# Patient Record
Sex: Female | Born: 1985
Health system: Southern US, Community
[De-identification: ages and names within clinical notes are randomized; demographics above are authoritative.]

## PROBLEM LIST (undated history)

## (undated) DIAGNOSIS — K219 Gastro-esophageal reflux disease without esophagitis: Secondary | ICD-10-CM

## (undated) DIAGNOSIS — Z8742 Personal history of other diseases of the female genital tract: Secondary | ICD-10-CM

## (undated) DIAGNOSIS — F319 Bipolar disorder, unspecified: Secondary | ICD-10-CM

## (undated) DIAGNOSIS — IMO0002 Reserved for concepts with insufficient information to code with codable children: Secondary | ICD-10-CM

## (undated) DIAGNOSIS — N750 Cyst of Bartholin's gland: Principal | ICD-10-CM

## (undated) DIAGNOSIS — Z8619 Personal history of other infectious and parasitic diseases: Secondary | ICD-10-CM

## (undated) DIAGNOSIS — N898 Other specified noninflammatory disorders of vagina: Secondary | ICD-10-CM

## (undated) DIAGNOSIS — F99 Mental disorder, not otherwise specified: Secondary | ICD-10-CM

## (undated) DIAGNOSIS — F419 Anxiety disorder, unspecified: Secondary | ICD-10-CM

## (undated) DIAGNOSIS — B379 Candidiasis, unspecified: Secondary | ICD-10-CM

## (undated) HISTORY — DX: Personal history of other diseases of the female genital tract: Z87.42

## (undated) HISTORY — PX: VEIN LIGATION AND STRIPPING: SHX2653

## (undated) HISTORY — DX: Candidiasis, unspecified: B37.9

## (undated) HISTORY — PX: NO PAST SURGERIES: SHX2092

## (undated) HISTORY — PX: WISDOM TOOTH EXTRACTION: SHX21

## (undated) HISTORY — DX: Bipolar disorder, unspecified: F31.9

## (undated) HISTORY — DX: Other specified noninflammatory disorders of vagina: N89.8

## (undated) HISTORY — DX: Cyst of Bartholin's gland: N75.0

## (undated) HISTORY — DX: Gastro-esophageal reflux disease without esophagitis: K21.9

## (undated) HISTORY — DX: Reserved for concepts with insufficient information to code with codable children: IMO0002

## (undated) HISTORY — DX: Personal history of other infectious and parasitic diseases: Z86.19

## (undated) SURGERY — Surgical Case
Anesthesia: *Unknown

---

## 2007-12-09 ENCOUNTER — Encounter (INDEPENDENT_AMBULATORY_CARE_PROVIDER_SITE_OTHER): Payer: Self-pay | Admitting: *Deleted

## 2008-01-19 ENCOUNTER — Ambulatory Visit: Payer: Self-pay | Admitting: Family Medicine

## 2008-01-19 DIAGNOSIS — F32A Depression, unspecified: Secondary | ICD-10-CM | POA: Insufficient documentation

## 2008-01-19 DIAGNOSIS — K219 Gastro-esophageal reflux disease without esophagitis: Secondary | ICD-10-CM | POA: Insufficient documentation

## 2008-01-19 DIAGNOSIS — F411 Generalized anxiety disorder: Secondary | ICD-10-CM | POA: Insufficient documentation

## 2008-01-20 ENCOUNTER — Telehealth (INDEPENDENT_AMBULATORY_CARE_PROVIDER_SITE_OTHER): Payer: Self-pay | Admitting: *Deleted

## 2008-01-23 ENCOUNTER — Encounter: Payer: Self-pay | Admitting: Family Medicine

## 2008-01-26 ENCOUNTER — Telehealth (INDEPENDENT_AMBULATORY_CARE_PROVIDER_SITE_OTHER): Payer: Self-pay | Admitting: *Deleted

## 2008-02-02 ENCOUNTER — Ambulatory Visit: Payer: Self-pay | Admitting: Family Medicine

## 2008-02-10 ENCOUNTER — Telehealth (INDEPENDENT_AMBULATORY_CARE_PROVIDER_SITE_OTHER): Payer: Self-pay | Admitting: *Deleted

## 2008-05-16 ENCOUNTER — Ambulatory Visit: Payer: Self-pay | Admitting: Family Medicine

## 2008-05-23 ENCOUNTER — Ambulatory Visit: Payer: Self-pay | Admitting: *Deleted

## 2008-06-01 ENCOUNTER — Ambulatory Visit: Payer: Self-pay | Admitting: *Deleted

## 2008-06-06 ENCOUNTER — Ambulatory Visit: Payer: Self-pay | Admitting: *Deleted

## 2008-06-13 ENCOUNTER — Ambulatory Visit: Payer: Self-pay | Admitting: *Deleted

## 2008-06-13 ENCOUNTER — Ambulatory Visit: Payer: Self-pay | Admitting: Family Medicine

## 2008-07-13 ENCOUNTER — Encounter: Payer: Self-pay | Admitting: Family Medicine

## 2008-12-17 ENCOUNTER — Ambulatory Visit: Payer: Self-pay | Admitting: Family Medicine

## 2008-12-17 DIAGNOSIS — L659 Nonscarring hair loss, unspecified: Secondary | ICD-10-CM | POA: Insufficient documentation

## 2008-12-17 LAB — CONVERTED CEMR LAB
Basophils Absolute: 0 10*3/uL (ref 0.0–0.1)
Eosinophils Absolute: 0.1 10*3/uL (ref 0.0–0.7)
Hemoglobin: 14.8 g/dL (ref 12.0–15.0)
Lymphocytes Relative: 34.9 % (ref 12.0–46.0)
MCHC: 34.2 g/dL (ref 30.0–36.0)
Monocytes Relative: 5.9 % (ref 3.0–12.0)
Neutro Abs: 3.3 10*3/uL (ref 1.4–7.7)
Neutrophils Relative %: 57.3 % (ref 43.0–77.0)
Platelets: 315 10*3/uL (ref 150.0–400.0)
Prolactin: 19.1 ng/mL
RDW: 11.7 % (ref 11.5–14.6)
TSH: 1.22 microintl units/mL (ref 0.35–5.50)

## 2008-12-18 LAB — CONVERTED CEMR LAB
Sex Hormone Binding: 35 nmol/L (ref 18–114)
Testosterone Free: 8.3 pg/mL — ABNORMAL HIGH (ref 0.6–6.8)
Testosterone-% Free: 1.7 % (ref 0.4–2.4)
Testosterone: 47.66 ng/dL (ref 10–70)

## 2008-12-19 ENCOUNTER — Telehealth (INDEPENDENT_AMBULATORY_CARE_PROVIDER_SITE_OTHER): Payer: Self-pay | Admitting: *Deleted

## 2009-01-31 ENCOUNTER — Telehealth (INDEPENDENT_AMBULATORY_CARE_PROVIDER_SITE_OTHER): Payer: Self-pay | Admitting: *Deleted

## 2009-08-28 ENCOUNTER — Encounter: Payer: Self-pay | Admitting: Family Medicine

## 2010-01-15 ENCOUNTER — Encounter: Payer: Self-pay | Admitting: Family Medicine

## 2010-05-02 DIAGNOSIS — Z8742 Personal history of other diseases of the female genital tract: Secondary | ICD-10-CM

## 2010-05-02 DIAGNOSIS — N750 Cyst of Bartholin's gland: Secondary | ICD-10-CM

## 2010-05-02 HISTORY — DX: Cyst of Bartholin's gland: N75.0

## 2010-05-02 HISTORY — DX: Personal history of other diseases of the female genital tract: Z87.42

## 2010-05-06 NOTE — Letter (Signed)
Summary: [facility]  [facility]   Imported By: Lanelle Bal 10/15/2009 12:20:25  _____________________________________________________________________  External Attachment:    Type:   Image     Comment:   External Document

## 2010-05-08 NOTE — Letter (Signed)
Summary: Coralie Carpen MD  Coralie Carpen MD   Imported By: Lanelle Bal 04/24/2010 13:30:25  _____________________________________________________________________  External Attachment:    Type:   Image     Comment:   External Document

## 2010-06-16 ENCOUNTER — Encounter: Payer: Self-pay | Admitting: Family Medicine

## 2010-06-16 ENCOUNTER — Encounter (INDEPENDENT_AMBULATORY_CARE_PROVIDER_SITE_OTHER): Payer: 59 | Admitting: Family Medicine

## 2010-06-16 DIAGNOSIS — Z0000 Encounter for general adult medical examination without abnormal findings: Secondary | ICD-10-CM

## 2010-06-16 DIAGNOSIS — Z3009 Encounter for other general counseling and advice on contraception: Secondary | ICD-10-CM

## 2010-06-19 ENCOUNTER — Other Ambulatory Visit: Payer: Self-pay | Admitting: Family Medicine

## 2010-06-19 ENCOUNTER — Other Ambulatory Visit (INDEPENDENT_AMBULATORY_CARE_PROVIDER_SITE_OTHER): Payer: 59

## 2010-06-19 ENCOUNTER — Encounter (INDEPENDENT_AMBULATORY_CARE_PROVIDER_SITE_OTHER): Payer: Self-pay | Admitting: *Deleted

## 2010-06-19 DIAGNOSIS — E785 Hyperlipidemia, unspecified: Secondary | ICD-10-CM

## 2010-06-19 DIAGNOSIS — Z0000 Encounter for general adult medical examination without abnormal findings: Secondary | ICD-10-CM

## 2010-06-19 LAB — CBC WITH DIFFERENTIAL/PLATELET
Basophils Absolute: 0.1 10*3/uL (ref 0.0–0.1)
Basophils Relative: 1 % (ref 0.0–3.0)
Eosinophils Absolute: 0.2 10*3/uL (ref 0.0–0.7)
Hemoglobin: 13.8 g/dL (ref 12.0–15.0)
Lymphocytes Relative: 38 % (ref 12.0–46.0)
MCHC: 34 g/dL (ref 30.0–36.0)
MCV: 93.1 fl (ref 78.0–100.0)
Monocytes Absolute: 0.5 10*3/uL (ref 0.1–1.0)
Neutro Abs: 3.4 10*3/uL (ref 1.4–7.7)
Neutrophils Relative %: 51.2 % (ref 43.0–77.0)
RBC: 4.36 Mil/uL (ref 3.87–5.11)
RDW: 13.5 % (ref 11.5–14.6)

## 2010-06-19 LAB — HEPATIC FUNCTION PANEL
AST: 14 U/L (ref 0–37)
Albumin: 3.9 g/dL (ref 3.5–5.2)
Total Bilirubin: 0.4 mg/dL (ref 0.3–1.2)

## 2010-06-19 LAB — BASIC METABOLIC PANEL
BUN: 14 mg/dL (ref 6–23)
Calcium: 9.2 mg/dL (ref 8.4–10.5)
Creatinine, Ser: 0.8 mg/dL (ref 0.4–1.2)
GFR: 88.69 mL/min (ref >60.00)
Glucose, Bld: 79 mg/dL (ref 70–99)

## 2010-06-19 LAB — LIPID PANEL
Cholesterol: 201 mg/dL — ABNORMAL HIGH (ref 0–200)
Total CHOL/HDL Ratio: 4
Triglycerides: 99 mg/dL (ref 0.0–149.0)
VLDL: 19.8 mg/dL (ref 0.0–40.0)

## 2010-06-24 NOTE — Assessment & Plan Note (Signed)
Summary: cpe/pt hasnt been seen since 2010/needs birth control/kn   Vital Signs:  Patient profile:   25 year old female Height:      66.75 inches (169.55 cm) Weight:      170.50 pounds (77.50 kg) BMI:     27.00 Temp:     97.0 degrees F (36.11 degrees C) oral BP sitting:   100 / 70  (left arm) Cuff size:   regular  Vitals Entered By: Lucious Groves CMA (June 16, 2010 3:28 PM) CC: CPX--no pap./kb Is Patient Diabetic? No Pain Assessment Patient in pain? no      Comments Patient needs birth control renewal, hairloss is somewhat better.   History of Present Illness: 25 yo woman here today for CPE.  irregular menses- hasn't been on birth control in over a year.  LMP- 2/29.  no chance of pregnancy.  pt reports that OCPs help decrease hair loss.  difficulty concentrating- seeing Dr Tomasa Rand.  Preventive Screening-Counseling & Management  Alcohol-Tobacco     Alcohol drinks/day: <1     Smoking Status: never  Caffeine-Diet-Exercise     Does Patient Exercise: yes     Type of exercise: walking, lifting, basketball      Drug Use:  never.    Current Medications (verified): 1)  Sprintec 28 0.25-35 Mg-Mcg Tabs (Norgestimate-Eth Estradiol) .... Take One Tablet Daily 2)  Lamictal 200 Mg Tabs (Lamotrigine) .... Once Daily 3)  Biotin (Dose Unknown) .... Two Daily 4)  Mvi .... Daily  Allergies (verified): No Known Drug Allergies  Past History:  Past Medical History: GERD Vaginal cyst Bipolar  Past Surgical History: vein stripping  Social History: Graduated w/ BA, works in jail for [employer], lives alone no tobacco has catDoes Patient Exercise:  yes Drug Use:  never  Review of Systems       The patient complains of anorexia and headaches.  The patient denies fever, weight loss, weight gain, vision loss, decreased hearing, hoarseness, chest pain, syncope, dyspnea on exertion, peripheral edema, prolonged cough, abdominal pain, melena, hematochezia, severe  indigestion/heartburn, hematuria, suspicious skin lesions, depression, abnormal bleeding, enlarged lymph nodes, and breast masses.         anorexia after breakup, headaches are stress related due to new job (per pt report)  Physical Exam  General:  Well-developed,well-nourished,in no acute distress; alert,appropriate and cooperative throughout examination Head:  Normocephalic and atraumatic without obvious abnormalities. No apparent alopecia or balding- no widening of part or loss of hair at crown. Eyes:  No corneal or conjunctival inflammation noted. EOMI. Perrla. Funduscopic exam benign, without hemorrhages, exudates or papilledema. Vision grossly normal. Ears:  External ear exam shows no significant lesions or deformities.  Otoscopic examination reveals clear canals, tympanic membranes are intact bilaterally without bulging, retraction, inflammation or discharge. Hearing is grossly normal bilaterally. Nose:  External nasal examination shows no deformity or inflammation. Nasal mucosa are pink and moist without lesions or exudates. Mouth:  Oral mucosa and oropharynx without lesions or exudates.  Teeth in good repair. Neck:  No deformities, masses, or tenderness noted. Breasts:  gyn Lungs:  Normal respiratory effort, chest expands symmetrically. Lungs are clear to auscultation, no crackles or wheezes. Heart:  Normal rate and regular rhythm. S1 and S2 normal without gallop, murmur, click, rub or other extra sounds. Abdomen:  Bowel sounds positive,abdomen soft and non-tender without masses, organomegaly or hernias noted. Genitalia:  gyn Pulses:  +2 carotid, radial, DP Extremities:  No clubbing, cyanosis, edema, or deformity noted with normal full range  of motion of all joints.   Neurologic:  No cranial nerve deficits noted. Station and gait are normal. Plantar reflexes are down-going bilaterally. DTRs are symmetrical throughout. Sensory, motor and coordinative functions appear intact. Skin:   Intact without suspicious lesions or rashes Cervical Nodes:  No lymphadenopathy noted Axillary Nodes:  No palpable lymphadenopathy Psych:  Cognition and judgment appear intact. Alert and cooperative with normal attention span and concentration. No apparent delusions, illusions, hallucinations   Impression & Recommendations:  Problem # 1:  PHYSICAL EXAMINATION (ICD-V70.0) Assessment New pt's PE WNL.  will return for labs when fasting.  anticipatory guidance provided.  UTD on pap w/ GYN  Problem # 2:  CONTRACEPTIVE MANAGEMENT (ICD-V25.09) Assessment: New restart OCPs after next period.  reviewed appropriate use.  Complete Medication List: 1)  Sprintec 28 0.25-35 Mg-mcg Tabs (Norgestimate-eth estradiol) .... Take one tablet daily 2)  Lamictal 200 Mg Tabs (Lamotrigine) .... Once daily 3)  Biotin (dose Unknown)  .... Two daily 4)  Mvi  .... Daily  Patient Instructions: 1)  Schedule a lab visit for later this week- do not eat before this appt 2)  BMP prior to visit, ICD-9: V70 3)  Hepatic Panel prior to visit ICD-9: V70 4)  Lipid panel prior to visit ICD-9 : V70 5)  TSH prior to visit ICD-9 : V70 6)  CBC w/ Diff prior to visit ICD-9 : V70 7)  Keep up the good work on diet and exercise- your exam looks great!!! 8)  We'll notify you of your lab results once we have them 9)  Restart the birth control the Sunday after your next bleed 10)  Call with any questions or concerns 11)  Hang in there!!!  Things will get better! Prescriptions: SPRINTEC 28 0.25-35 MG-MCG TABS (NORGESTIMATE-ETH ESTRADIOL) take one tablet daily  #28 x 11   Entered and Authorized by:   Duvid Smalls MD   Signed by:   Margurite Duffy MD on 06/16/2010   Method used:   Electronically to        [facility] [address] (retail)       [address]       [place], [place]  [address]       Ph: [phone] or [phone]       Fax: [phone]   RxID:   [id number]    Orders  Added: 1)  Est. Patient 18-39 years [phone]

## 2010-10-16 ENCOUNTER — Encounter (HOSPITAL_COMMUNITY): Payer: Self-pay | Admitting: *Deleted

## 2010-10-27 ENCOUNTER — Telehealth: Payer: Self-pay | Admitting: *Deleted

## 2010-10-27 NOTE — Telephone Encounter (Signed)
Pt called back and states that she will come pick it up this PM per Okey Regal.

## 2010-10-27 NOTE — Telephone Encounter (Signed)
I called pt about her paperwork to find out if she would like to pick it up or have our office mail it to her. Left message on voicemail to call the office.

## 2010-10-29 ENCOUNTER — Other Ambulatory Visit: Payer: Self-pay | Admitting: Obstetrics and Gynecology

## 2010-10-29 ENCOUNTER — Ambulatory Visit (HOSPITAL_COMMUNITY): Payer: 59 | Admitting: Anesthesiology

## 2010-10-29 ENCOUNTER — Ambulatory Visit (HOSPITAL_COMMUNITY)
Admission: RE | Admit: 2010-10-29 | Discharge: 2010-10-29 | Disposition: A | Discharge status: Home / Self Care | Payer: 59 | Source: Ambulatory Visit | Attending: Obstetrics and Gynecology | Admitting: Obstetrics and Gynecology

## 2010-10-29 ENCOUNTER — Encounter (HOSPITAL_COMMUNITY): Payer: Self-pay | Admitting: Anesthesiology

## 2010-10-29 ENCOUNTER — Encounter (HOSPITAL_COMMUNITY)
Admission: RE | Disposition: A | Discharge status: Home / Self Care | Payer: Self-pay | Source: Ambulatory Visit | Attending: Obstetrics and Gynecology

## 2010-10-29 DIAGNOSIS — N840 Polyp of corpus uteri: Secondary | ICD-10-CM | POA: Insufficient documentation

## 2010-10-29 DIAGNOSIS — K219 Gastro-esophageal reflux disease without esophagitis: Secondary | ICD-10-CM

## 2010-10-29 DIAGNOSIS — L659 Nonscarring hair loss, unspecified: Secondary | ICD-10-CM

## 2010-10-29 DIAGNOSIS — F411 Generalized anxiety disorder: Secondary | ICD-10-CM

## 2010-10-29 DIAGNOSIS — Z9889 Other specified postprocedural states: Secondary | ICD-10-CM

## 2010-10-29 HISTORY — DX: Mental disorder, not otherwise specified: F99

## 2010-10-29 HISTORY — DX: Anxiety disorder, unspecified: F41.9

## 2010-10-29 LAB — CBC
MCV: 91.3 fL (ref 78.0–100.0)
Platelets: 300 10*3/uL (ref 150–400)
RBC: 4.58 MIL/uL (ref 3.87–5.11)
WBC: 6.7 10*3/uL (ref 4.0–10.5)

## 2010-10-29 SURGERY — DILATATION & CURETTAGE/HYSTEROSCOPY WITH RESECTOCOPE
Anesthesia: General

## 2010-10-29 MED ORDER — GLYCINE 1.5 % IR SOLN
Status: DC | PRN
  Administered 2010-10-29: 3000 mL

## 2010-10-29 MED ORDER — LIDOCAINE HCL 1 % IJ SOLN
INTRAMUSCULAR | Status: DC | PRN
  Administered 2010-10-29: 15 mL via INTRADERMAL

## 2010-10-29 MED ORDER — MIDAZOLAM HCL 5 MG/5ML IJ SOLN
INTRAMUSCULAR | Status: DC | PRN
  Administered 2010-10-29: 2 mg via INTRAVENOUS

## 2010-10-29 MED ORDER — LIDOCAINE HCL (CARDIAC) 20 MG/ML IV SOLN
INTRAVENOUS | Status: DC | PRN
  Administered 2010-10-29: 80 mg via INTRAVENOUS

## 2010-10-29 MED ORDER — DEXAMETHASONE SODIUM PHOSPHATE 10 MG/ML IJ SOLN
INTRAMUSCULAR | Status: AC
  Filled 2010-10-29: qty 1

## 2010-10-29 MED ORDER — FENTANYL CITRATE 0.05 MG/ML IJ SOLN
INTRAMUSCULAR | Status: AC
  Filled 2010-10-29: qty 2

## 2010-10-29 MED ORDER — KETOROLAC TROMETHAMINE 30 MG/ML IJ SOLN
15.0000 mg | Freq: Once | INTRAMUSCULAR | Status: AC | PRN
  Administered 2010-10-29: 30 mg via INTRAVENOUS

## 2010-10-29 MED ORDER — LACTATED RINGERS IV SOLN
INTRAVENOUS | Status: DC
  Administered 2010-10-29 (×2): via INTRAVENOUS

## 2010-10-29 MED ORDER — FENTANYL CITRATE 0.05 MG/ML IJ SOLN
INTRAMUSCULAR | Status: DC | PRN
  Administered 2010-10-29 (×2): 50 ug via INTRAVENOUS

## 2010-10-29 MED ORDER — FENTANYL CITRATE 0.05 MG/ML IJ SOLN
INTRAMUSCULAR | Status: AC
  Administered 2010-10-29: 25 ug via INTRAVENOUS
  Filled 2010-10-29: qty 2

## 2010-10-29 MED ORDER — MIDAZOLAM HCL 2 MG/2ML IJ SOLN
INTRAMUSCULAR | Status: AC
  Filled 2010-10-29: qty 2

## 2010-10-29 MED ORDER — HYDROCODONE-ACETAMINOPHEN 5-500 MG PO TABS: 1.0000 | ORAL_TABLET | Freq: Four times a day (QID) | ORAL | Status: AC | PRN

## 2010-10-29 MED ORDER — FENTANYL CITRATE 0.05 MG/ML IJ SOLN
25.0000 ug | INTRAMUSCULAR | Status: DC | PRN
  Administered 2010-10-29: 25 ug via INTRAVENOUS

## 2010-10-29 MED ORDER — ONDANSETRON HCL 4 MG/2ML IJ SOLN
INTRAMUSCULAR | Status: AC
  Filled 2010-10-29: qty 2

## 2010-10-29 MED ORDER — PROPOFOL 10 MG/ML IV EMUL
INTRAVENOUS | Status: AC
  Filled 2010-10-29: qty 20

## 2010-10-29 MED ORDER — KETOROLAC TROMETHAMINE 30 MG/ML IJ SOLN
INTRAMUSCULAR | Status: AC
  Administered 2010-10-29: 30 mg via INTRAVENOUS
  Filled 2010-10-29: qty 1

## 2010-10-29 MED ORDER — PROPOFOL 10 MG/ML IV EMUL
INTRAVENOUS | Status: DC | PRN
  Administered 2010-10-29: 200 mg via INTRAVENOUS

## 2010-10-29 MED ORDER — IBUPROFEN 200 MG PO TABS: 600.0000 mg | ORAL_TABLET | Freq: Four times a day (QID) | ORAL | Status: AC | PRN

## 2010-10-29 MED ORDER — LIDOCAINE HCL (CARDIAC) 20 MG/ML IV SOLN
INTRAVENOUS | Status: AC
  Filled 2010-10-29: qty 5

## 2010-10-29 MED ORDER — GLYCOPYRROLATE 0.2 MG/ML IJ SOLN
INTRAMUSCULAR | Status: DC | PRN
  Administered 2010-10-29: 0.2 mg via INTRAVENOUS

## 2010-10-29 SURGICAL SUPPLY — 37 items
ABLATOR ENDOMETRIAL BIPOLAR (ABLATOR) IMPLANT
BLADE SURG 11 STRL SS (BLADE) ×2 IMPLANT
BLADE SURG 15 STRL LF C SS BP (BLADE) ×1 IMPLANT
BLADE SURG 15 STRL SS (BLADE) ×1
CANISTER SUCTION 2500CC (MISCELLANEOUS) ×2 IMPLANT
CATH ROBINSON RED A/P 16FR (CATHETERS) ×2 IMPLANT
CLOTH BEACON ORANGE TIMEOUT ST (SAFETY) ×2 IMPLANT
CONTAINER PREFILL 10% NBF 15ML (MISCELLANEOUS) ×2 IMPLANT
CONTAINER PREFILL 10% NBF 60ML (FORM) ×4 IMPLANT
COUNTER NEEDLE 1200 MAGNETIC (NEEDLE) IMPLANT
DRAPE UTILITY XL STRL (DRAPES) ×4 IMPLANT
ELECT REM PT RETURN 9FT ADLT (ELECTROSURGICAL) ×2
ELECTRODE REM PT RTRN 9FT ADLT (ELECTROSURGICAL) ×1 IMPLANT
ELECTRODE ROLLER BARREL 22FR (ELECTROSURGICAL) IMPLANT
ELECTRODE VAPORCUT 22FR (ELECTROSURGICAL) IMPLANT
GLOVE BIO SURGEON STRL SZ 6.5 (GLOVE) ×2 IMPLANT
GLOVE BIO SURGEON STRL SZ7 (GLOVE) ×4 IMPLANT
GLOVE BIOGEL PI IND STRL 7.0 (GLOVE) ×1 IMPLANT
GLOVE BIOGEL PI INDICATOR 7.0 (GLOVE) ×1
GOWN PREVENTION PLUS LG XLONG (DISPOSABLE) ×4 IMPLANT
LOOP ANGLED CUTTING 22FR (CUTTING LOOP) IMPLANT
NEEDLE HYPO 25X1 1.5 SAFETY (NEEDLE) ×2 IMPLANT
NS IRRIG 1000ML POUR BTL (IV SOLUTION) ×2 IMPLANT
PACK HYSTEROSCOPY LF (CUSTOM PROCEDURE TRAY) ×2 IMPLANT
PACK VAGINAL MINOR WOMEN LF (CUSTOM PROCEDURE TRAY) ×2 IMPLANT
PAD PREP 24X48 CUFFED NSTRL (MISCELLANEOUS) ×2 IMPLANT
PENCIL BUTTON HOLSTER BLD 10FT (ELECTRODE) IMPLANT
SUT CHROMIC 3 0 PS 2 (SUTURE) ×2 IMPLANT
SUT VIC AB 4-0 PS2 27 (SUTURE) IMPLANT
SWAB CULTURE LIQ STUART DBL (MISCELLANEOUS) IMPLANT
SYR 20CC LL (SYRINGE) ×2 IMPLANT
SYR CONTROL 10ML LL (SYRINGE) IMPLANT
TOWEL OR 17X24 6PK STRL BLUE (TOWEL DISPOSABLE) ×4 IMPLANT
TUBE ANAEROBIC SPECIMEN COL (MISCELLANEOUS) IMPLANT
TUBING NON-CON 1/4 X 20 CONN (TUBING) IMPLANT
WATER STERILE IRR 1000ML POUR (IV SOLUTION) ×2 IMPLANT
YANKAUER SUCT BULB TIP NO VENT (SUCTIONS) IMPLANT

## 2010-10-29 NOTE — H&P (Signed)
Dictated [phone]

## 2010-10-29 NOTE — Brief Op Note (Signed)
10/29/2010  10:27 AM  PATIENT:  Carrie Diaz  25 y.o. female  PRE-OPERATIVE DIAGNOSIS:  endometrial polyp;barthlon cyst  POST-OPERATIVE DIAGNOSIS:  endometrial polyp;barthlon cyst resolved  PROCEDURE:  Procedure(s): DILATATION & CURETTAGE/HYSTEROSCOPY WITH RESECTOCOPE  SURGEON:  Surgeon(s): Emilyanne Mcgough A Lehua Flores, MD  PHYSICIAN ASSISTANT:   ASSISTANTS: none   ANESTHESIA:   general and paracervical block  ESTIMATED BLOOD LOSS: MINIMAL  BLOOD ADMINISTERED:none  DRAINS: none   LOCAL MEDICATIONS USED:  LIDOCAINE 15CC  SPECIMEN:  Source of Specimen:  endometrial curettings  DISPOSITION OF SPECIMEN:  PATHOLOGY  COUNTS:  YES  TOURNIQUET:  * No tourniquets in log *  DICTATION #: The patient was taken to the operating room and prepped and draped in a normal sterile fashion. An in out catheter was used to drain the bladder a bivalve speculum was placed into the vagina and anterior lip of the cervix was grasped with a single-tooth tenaculum.  15 cc of 1% lidocaine was used for cervical block.  the cervix was then dilated with Shawnie Pons dilators up to 21. The hysteroscope was placed into the uterine cavity. The cervix and the entire uterus and both ostia were visualized. There were no polyps. Just fluffy endometrium. I then decreased the  fluid  pressure from the outflow of the fluid into the uterus and still no polyp was visualized. Hyseroscope was then removed from the uterus. A sharp curettage was then done with a curette and endometrial curettings were obtained. The endometrial curettings were sent to pathology. Again the hysteroscope was placed into the uterine cavity. Both ostia were again visualized there were no polyps or submucosal fibroids or endometrial masses seen. The tenaculum was removed from the cervix and hemostasis was noted.  Tenaculum was removed from the  cervix. The vagina and labia were inspected  and there was no remnants of a Bartholin's cyst.   the marsupialization did not need  to be done.  PLAN OF CARE: discharge to home  PATIENT DISPOSITION:  PACU - hemodynamically stable.   Delay start of Pharmacological VTE agent (>24hrs) due to surgical blood loss or risk of bleeding:  no

## 2010-10-29 NOTE — H&P (Signed)
Carrie Diaz, MACDOWELL                 ACCOUNT NO.:  [id number]  MEDICAL RECORD NO.:  [id number]  LOCATION:                                 FACILITY:  PHYSICIAN:  Trevor Duty A. Kamaury Cutbirth, M.D. DATE OF BIRTH:  06-Feb-1986  DATE OF ADMISSION: DATE OF DISCHARGE:                             HISTORY & PHYSICAL   CHIEF COMPLAINT:  Endometrial polyps and Bartholin cyst.  The patient is a 25 year old gravida 0 who presented to me in January 2012 with dysmenorrhea and complaining of the Bartholin cyst.  She states the Bartholin cyst has been there for years but it has become more tender than usual.  No fevers or chills, and she does want marsupialization.  She stated that her menses were every month lasting for 4 to 5 days, changes pad every 2-3 hours with dysmenorrhea.  Midol is the only thing that really helps.  The patient had an ultrasound which demonstrated uterus measuring 3.4 x 6.3 x 4.3 with normal ovaries. She had a polyp it was 0.74 x 1.17 x 0.93 cm.  The patient decided to have a polypectomy.  She had an I and D of her Bartholin abscess in June 2012 and desires marsupialization of it in anyway present at the time of surgery.  The patient has no known drug allergies.  Her medications including Sprintec, Xanax, and Lamictal.  PAST MEDICAL HISTORY:  Significant for bipolar disorder as above.  FAMILY HISTORY:  No breast or ovarian cancer.  The patient is not allergic to any medicines.  REVIEW OF SYSTEMS:  PSYCHIATRIC:  Significant for bipolar disorder. GENITOURINARY:  Significant for dysmenorrhea and Bartholin. MUSCULOSKELETAL:  No weakness.  CARDIOVASCULAR:  No heart palpitations.  PHYSICAL EXAMINATION:  VITAL SIGNS:  Blood pressure 100/60, weight 164 pounds.  She is 5 feet 7 inches with a BMI of 26. HEENT:  Her pupils are equal.  Her hearing is normal. NECK:  Her throat is clear.  Thyroid is not enlarged. HEART:  Regular rate and rhythm. CHEST:  Lungs are clear to auscultation  bilaterally. BREASTS:  No masses, discharge, skin changes, or nipple retraction. BACK:  No CVA tenderness. ABDOMEN:  Nontender without any mass or organomegaly. EXTREMITIES:  No cyanosis, clubbing, or edema. NEUROLOGICAL:  Within normal limits. GENITALIA:  Full vaginal exam is within normal limits.  Cervix is nontender without any lesions.  On her vulva exam, she does have a left labia Bartholin cyst.  The uterus is normal shape, size, consistency, and nontender.  Adnexa no masses and nontender.  ASSESSMENT:  Endometrial polyps and Bartholin gland cyst.  __________ abscess in June which was drained and cyst is still present.  She desires marsupialization.  She understands the risks are not limited to bleeding, infection, and dyspareunia.  The patient also has endometrial polyps.  She was given the choice of observation versus a D and C hysteroscopy.  She chose the latter.  She understands the risks are not limited to bleeding, infection, damage to internal organs such as bowel, bladder, and major blood vessels.     [name] A. Normand Sloop, M.D.     NAD/MEDQ  D:  10/28/2010  T:  10/29/2010  Job:  [id number] 

## 2010-10-29 NOTE — Anesthesia Postprocedure Evaluation (Addendum)
  Anesthesia Post-op Note  Patient: Carrie Diaz  Procedure(s) Performed:  DILATATION & CURETTAGE/HYSTEROSCOPY WITH RESECTOCOPE - No resectoscope  Patient Location: PACU  Anesthesia Type: General  Level of Consciousness: awake and oriented  Airway and Oxygen Therapy: Patient Spontanous Breathing and Patient connected to nasal cannula oxygen  Post-op Pain: mild  Post-op Assessment: Post-op Vital signs reviewed  Post-op Vital Signs: Reviewed and stable  Complications: No apparent anesthesia complications  No anesthesia complications.  Level of consciousness: alert. Cardiopulmonary status stable.  No follow-up care or observation required.  Dorianne Perret L. Rodman Pickle, MD

## 2010-10-29 NOTE — Anesthesia Preprocedure Evaluation (Signed)
Anesthesia Evaluation  Name, MR# and DOB Patient awake  General Assessment Comment  Reviewed: Allergy & Precautions, H&P , Patient's Chart, lab work & pertinent test results and reviewed documented beta blocker date and time   History of Anesthesia Complications Negative for: history of anesthetic complications  Airway Mallampati: III TM Distance: >3 FB Neck ROM: full    Dental  (+) Chipped,    Pulmonaryneg pulmonary ROS    clear to auscultation    Cardiovascular regular Normal   Neuro/Psych (+) {AN ROS/MED HX NEURO HEADACHES (+) PSYCHIATRIC DISORDERS (anxiety, bipolar d/o - did not take meds today),   GI/Hepatic/Renal negative Liver ROS, and negative Renal ROS (+)  GERD (currently not on medication.  relates more to timing of eating/sleeping)      Endo/Other  Negative Endocrine ROS (+)   Abdominal   Musculoskeletal  Hematology negative hematology ROS (+)   Peds  Reproductive/Obstetrics negative OB ROS   Anesthesia Other Findings             Anesthesia Physical Anesthesia Plan  ASA: II  Anesthesia Plan: General   Post-op Pain Management:    Induction:   Airway Management Planned:   Additional Equipment:   Intra-op Plan:   Post-operative Plan:   Informed Consent: I have reviewed the patients History and Physical, chart, labs and discussed the procedure including the risks, benefits and alternatives for the proposed anesthesia with the patient or authorized representative who has indicated his/her understanding and acceptance.   Dental Advisory Given  Plan Discussed with:   Anesthesia Plan Comments:         Anesthesia Quick Evaluation

## 2010-10-29 NOTE — Transfer of Care (Signed)
Immediate Anesthesia Transfer of Care Note  Patient: Carrie Diaz  Procedure(s) Performed:  DILATATION & CURETTAGE/HYSTEROSCOPY WITH RESECTOCOPE - No resectoscope  Patient Location: PACU  Anesthesia Type: MAC and General  Level of Consciousness: awake, alert , oriented and patient cooperative  Airway & Oxygen Therapy: Patient Spontanous Breathing and Patient connected to nasal cannula oxygen  Post-op Assessment: Report given to PACU RN and Post -op Vital signs reviewed and stable  Post vital signs: Reviewed and stable  Complications: No apparent anesthesia complications

## 2011-01-02 ENCOUNTER — Encounter: Payer: Self-pay | Admitting: Family Medicine

## 2011-01-05 ENCOUNTER — Encounter: Payer: Self-pay | Admitting: Family Medicine

## 2011-01-05 ENCOUNTER — Other Ambulatory Visit: Payer: 59

## 2011-01-05 ENCOUNTER — Ambulatory Visit (INDEPENDENT_AMBULATORY_CARE_PROVIDER_SITE_OTHER): Payer: 59 | Admitting: Family Medicine

## 2011-01-05 VITALS — BP 110/78 | HR 80 | Temp 97.8°F | Wt 170.0 lb

## 2011-01-05 DIAGNOSIS — Z79899 Other long term (current) drug therapy: Secondary | ICD-10-CM

## 2011-01-05 DIAGNOSIS — F3189 Other bipolar disorder: Secondary | ICD-10-CM

## 2011-01-05 DIAGNOSIS — Z13220 Encounter for screening for lipoid disorders: Secondary | ICD-10-CM

## 2011-01-05 DIAGNOSIS — M549 Dorsalgia, unspecified: Secondary | ICD-10-CM

## 2011-01-05 LAB — LIPID PANEL
Cholesterol: 174 mg/dL (ref 0–200)
HDL: 61.9 mg/dL (ref >39.00)
LDL Cholesterol: 97 mg/dL (ref 0–99)
Triglycerides: 77 mg/dL (ref 0.0–149.0)
VLDL: 15.4 mg/dL (ref 0.0–40.0)

## 2011-01-05 LAB — POCT URINALYSIS DIPSTICK
Bilirubin, UA: NEGATIVE
Glucose, UA: NEGATIVE
Ketones, UA: NEGATIVE
Leukocytes, UA: NEGATIVE
Protein, UA: NEGATIVE
Spec Grav, UA: 1.005

## 2011-01-05 MED ORDER — CYCLOBENZAPRINE HCL 10 MG PO TABS: 10.0000 mg | ORAL_TABLET | Freq: Three times a day (TID) | ORAL | Status: DC | PRN

## 2011-01-05 MED ORDER — NAPROXEN 500 MG PO TABS: 500.0000 mg | ORAL_TABLET | Freq: Two times a day (BID) | ORAL | Status: DC

## 2011-01-05 NOTE — Progress Notes (Signed)
Quick Note:    Labs mailed.  ______

## 2011-01-05 NOTE — Assessment & Plan Note (Signed)
Pt's pain is most likely muscular.  No red flags on hx or PE.  Start scheduled NSAIDs, muscle relaxer prn.  Heat, stretching for pain relief.  Reviewed supportive care and red flags that should prompt return.  Pt expressed understanding and is in agreement w/ plan.

## 2011-01-05 NOTE — Patient Instructions (Signed)
This all appears to be muscular and should improve w/ time Use the Naproxen regularly for the next 7 days- take w/ food Use the muscle relaxer before bed and on your days off- this will make you drowsiness so no driving! HEAT!!! Do some gentle stretching throughout the day Call with any questions or concerns Good luck w/ the job hunt!!

## 2011-01-05 NOTE — Progress Notes (Signed)
  Subjective:    Patient ID: Carrie Diaz, female    DOB: [birth year], 25 y.o.   MRN: [id number]  HPI Back pain- thinks she pulled a muscle in lower back last week.  Is wearing a sherrif's belt at work and feels like this is part of the problem.  Using ibuprofen w/ some relief.  Will also have some tightness in upper back.  Doesn't wear a bullet-proof vest at work.  Able to move w/out difficulty.  No radiating pain into butt or legs.   Review of Systems For ROS see HPI     Objective:   Physical Exam  Vitals reviewed. Constitutional: She is oriented to person, place, and time. She appears well-developed and well-nourished. No distress.  Musculoskeletal: Normal range of motion. She exhibits tenderness (mild TTP over lumbar paraspinals bilaterally, + TTP over trap spasm bilaterally). She exhibits no edema.       Full flexion and extension, (-) SLR bilaterally  Neurological: She is alert and oriented to person, place, and time. She has normal reflexes. Coordination normal.          Assessment & Plan:

## 2011-05-08 ENCOUNTER — Telehealth: Payer: Self-pay | Admitting: *Deleted

## 2011-05-08 NOTE — Telephone Encounter (Signed)
Noted a rx came into office via fax and noted illegible, called MD Carrie Diaz office and spoke to Carrie Diaz and received verbal orders per pt Carrie Diaz had called in for orders to be sent to our office per pt has upcoming visit with [place], noted order as follows:  Carbamazepine Level Liver Function Test CBC w diff   DX:V58.69 and 296.89 Directions: Do Not Draw Sooner than 12 hours After last Carrie Diaz Get Blood Drawn Late  Jan 2013 or Early Feb 2013  Called pt to advise about lab instructions, left pt message to call office back

## 2011-05-12 NOTE — Telephone Encounter (Signed)
Pt called in to ask to have her blood drawn today for the order requested by MD Tomasa Rand, advised per orders from MD [facility] that we can not draw the lab until she has had the medication equestro in her system at least 12 hours and until his office confirms a different order we have to go by what MD Tomasa Rand noted, pt advised that she has apt with him today and that she will have him clarify the order so she can come in. Advised that MD [facility] office needs to call order in per last rx sent was illegable. Pt understood

## 2011-06-04 DIAGNOSIS — IMO0002 Reserved for concepts with insufficient information to code with codable children: Secondary | ICD-10-CM

## 2011-06-04 DIAGNOSIS — R87612 Low grade squamous intraepithelial lesion on cytologic smear of cervix (LGSIL): Secondary | ICD-10-CM

## 2011-06-04 HISTORY — DX: Reserved for concepts with insufficient information to code with codable children: IMO0002

## 2011-06-04 HISTORY — DX: Low grade squamous intraepithelial lesion on cytologic smear of cervix (LGSIL): R87.612

## 2011-06-06 ENCOUNTER — Encounter (HOSPITAL_COMMUNITY): Payer: Self-pay | Admitting: Emergency Medicine

## 2011-06-06 ENCOUNTER — Emergency Department (HOSPITAL_COMMUNITY)
Admission: EM | Admit: 2011-06-06 | Discharge: 2011-06-06 | Disposition: A | Discharge status: Home / Self Care | Payer: 59 | Attending: Emergency Medicine | Admitting: Emergency Medicine

## 2011-06-06 DIAGNOSIS — N751 Abscess of Bartholin's gland: Secondary | ICD-10-CM

## 2011-06-06 DIAGNOSIS — F319 Bipolar disorder, unspecified: Secondary | ICD-10-CM | POA: Insufficient documentation

## 2011-06-06 DIAGNOSIS — Z79899 Other long term (current) drug therapy: Secondary | ICD-10-CM | POA: Insufficient documentation

## 2011-06-06 DIAGNOSIS — N949 Unspecified condition associated with female genital organs and menstrual cycle: Secondary | ICD-10-CM | POA: Insufficient documentation

## 2011-06-06 MED ORDER — HYDROMORPHONE HCL PF 2 MG/ML IJ SOLN
2.0000 mg | Freq: Once | INTRAMUSCULAR | Status: AC
  Administered 2011-06-06: 2 mg via INTRAMUSCULAR
  Filled 2011-06-06: qty 1

## 2011-06-06 MED ORDER — ONDANSETRON HCL 4 MG PO TABS: 4.0000 mg | ORAL_TABLET | Freq: Three times a day (TID) | ORAL | Status: AC | PRN

## 2011-06-06 MED ORDER — HYDROCODONE-ACETAMINOPHEN 5-325 MG PO TABS: 1.0000 | ORAL_TABLET | ORAL | Status: AC | PRN

## 2011-06-06 MED ORDER — ONDANSETRON 4 MG PO TBDP
4.0000 mg | ORAL_TABLET | Freq: Once | ORAL | Status: AC
  Administered 2011-06-06: 4 mg via ORAL
  Filled 2011-06-06: qty 1

## 2011-06-06 NOTE — ED Notes (Signed)
Pt reports, "I have a Cyst." Pt had cyst for over a year, had it drained a week and a half ago. Pt continues to have pain.

## 2011-06-06 NOTE — Discharge Instructions (Signed)
Please followup with spell it Dr. Normand Sloop on Monday as previously scheduled.  Try to leave the antibiotic packing in until your appointment.  Continue taking the Augmentin until it is gone.  Use the prescribed pain medication as needed.  If you need additional pain medication, you may take ibuprofen.  Please do not use additional tylenol was using the prescribed medicine.  Return to the ER if you have uncontrolled pain, overlying redness, increased swelling, or develop fevers greater than 100.4.  You may return to the ER at any time for worsening condition or any new symptoms that concern you.   Bartholin's Cyst or Abscess Bartholin's glands are small glands located within the folds of skin (labia) along the sides of the lower opening of the vagina (birth canal). A cyst may develop when the duct of the gland becomes blocked. When this happens, fluid that accumulates within the cyst can become infected. This is known as an abscess. The Bartholin gland produces a mucous fluid to lubricate the outside of the vagina during sexual intercourse. SYMPTOMS   Patients with a small cyst may not have any symptoms.   Mild discomfort to severe pain depending on the size of the cyst and if it is infected (abscess).   Pain, redness, and swelling around the lower opening of the vagina.   Painful intercourse.   Pressure in the perineal area.   Swelling of the lips of the vagina (labia).   The cyst or abscess can be on one side or both sides of the vagina.  DIAGNOSIS   A large swelling is seen in the lower vagina area by your caregiver.   Painful to touch.   Redness and pain, if it is an abscess.  TREATMENT   Sometimes the cyst will go away on its own.   Apply warm wet compresses to the area or take hot sitz baths several times a day.   An incision to drain the cyst or abscess with local anesthesia.   Culture the pus, if it is an abscess.   Antibiotic treatment, if it is an abscess.   Cut open  the gland and suture the edges to make the opening of the gland bigger (marsupialization).   Remove the whole gland if the cyst or abscess returns.  PREVENTION   Practice good hygiene.   Clean the vaginal area with a mild soap and soft cloth when bathing.   Do not rub hard in the vaginal area when bathing.   Protect the crotch area with a padded cushion if you take long bike rides or ride horses.   Be sure you are well lubricated when you have sexual intercourse.  HOME CARE INSTRUCTIONS   If your cyst or abscess was opened, a small piece of gauze, or a drain, may have been placed in the wound to allow drainage. Do not remove this gauze or drain unless directed by your caregiver.   Wear feminine pads, not tampons, as needed for any drainage or bleeding.   If antibiotics were prescribed, take them exactly as directed. Finish the entire course.   Only take over-the-counter or prescription medicines for pain, discomfort, or fever as directed by your caregiver.  SEEK IMMEDIATE MEDICAL CARE IF:   You have an increase in pain, redness, swelling, or drainage.   You have bleeding from the wound which results in the use of more than the number of pads suggested by your caregiver in 24 hours.   You have chills.   You  have a fever.   You develop any new problems (symptoms) or aggravation of your existing condition.  MAKE SURE YOU:   Understand these instructions.   Will watch your condition.   Will get help right away if you are not doing well or get worse.  Document Released: 03/23/2005 Document Revised: 12/03/2010 Document Reviewed: 11/09/2007 [facility] Patient Information 2012 [place], [place].

## 2011-06-06 NOTE — ED Provider Notes (Signed)
History     CSN: [id number]  Arrival date & time 06/06/11  7829   First MD Initiated Contact with Patient 06/06/11 [phone]      Chief Complaint  Patient presents with  . Bartholin's Cyst    (Consider location/radiation/quality/duration/timing/severity/associated sxs/prior treatment) HPI Comments: Patient reports increased swelling in her left Bartholin's gland for the past week and a half.  Patient states she has a history of abscess in this Bartholin's gland and for the past week and a half has been to one nursing care and one emergency department with I&D.  Each time has been placed on several antibiotics, and is now on Augmentin.  States she has an appointment with her OB/GYN Dr. Normand Sloop on Monday, but the pain has increased so much that she cannot tolerate it and cannot wait for the appointment.  Denies fevers, abnormal vaginal bleeding, or discharge, abdominal pain, nausea, vomiting.  Last menstrual period was six days ago, was on time and normal. Per chart, pt had I&D June 2012 then marsupialization July 2012 by Dr Normand Sloop.    The history is provided by the patient and medical records.    Past Medical History  Diagnosis Date  . Mental disorder     bi-polar  . Anxiety     has prn med for anxiety  . GERD (gastroesophageal reflux disease)   . Bipolar disorder   . Vaginal cyst     Past Surgical History  Procedure Date  . No past surgeries   . Vein ligation and stripping     Family History  Problem Relation Age of Onset  . Depression Mother     AND ANXIETY  . Alcohol abuse Other   . Depression Other   . Diabetes Other     History  Substance Use Topics  . Smoking status: Never Smoker   . Smokeless tobacco: Not on file  . Alcohol Use: 1.2 oz/week    2 Cans of beer per week    OB History    Grav Para Term Preterm Abortions TAB SAB Ect Mult Living                  Review of Systems  Constitutional: Negative for fever.  Gastrointestinal: Negative for nausea,  vomiting and abdominal pain.  Genitourinary: Negative for dysuria, urgency, frequency, vaginal bleeding, vaginal discharge and menstrual problem.  All other systems reviewed and are negative.    Allergies  Review of patient's allergies indicates no known allergies.  Home Medications   Current Outpatient Rx  Name Route Sig Dispense Refill  . AMOXICILLIN-POT CLAVULANATE 875-125 MG PO TABS Oral Take 1 tablet by mouth 2 (two) times daily.    . BUPROPION HCL ER (XL) 150 MG PO TB24 Oral Take 450 mg by mouth daily.    Marland Kitchen CARBAMAZEPINE ER (ANTIPSYCH) 100 MG PO CP12 Oral Take 100 mg by mouth 2 (two) times daily.    Marland Kitchen CLONAZEPAM 0.5 MG PO TABS Oral Take 0.5 mg by mouth 2 (two) times daily as needed. For anxiety    . CYCLOBENZAPRINE HCL 10 MG PO TABS Oral Take 1 tablet (10 mg total) by mouth every 8 (eight) hours as needed for muscle spasms. 30 tablet 1  . LAMOTRIGINE 200 MG PO TABS Oral Take 200 mg by mouth at bedtime.      Marland Kitchen HYDROCODONE-ACETAMINOPHEN 5-325 MG PO TABS Oral Take 1 tablet by mouth every 4 (four) hours as needed for pain. 15 tablet 0  . ONDANSETRON HCL 4  MG PO TABS Oral Take 1 tablet (4 mg total) by mouth every 8 (eight) hours as needed for nausea. 12 tablet 0    BP 115/74  Pulse 86  Temp(Src) 98.3 F (36.8 C) (Oral)  SpO2 100%  LMP 06/06/2011  Physical Exam  Nursing note and vitals reviewed. Constitutional: She is oriented to person, place, and time. She appears well-developed and well-nourished. No distress.  HENT:  Head: Normocephalic and atraumatic.  Neck: Neck supple.  Pulmonary/Chest: Effort normal.  Genitourinary:     Neurological: She is alert and oriented to person, place, and time.  Skin: She is not diaphoretic.    ED Course  Procedures (including critical care time)  Labs Reviewed - No data to display No results found.  INCISION AND DRAINAGE Performed by: Rise Patience Consent: Verbal consent obtained. Risks and benefits: risks, benefits and  alternatives were discussed Type: abscess  Body area: left bartholin's gland   Anesthesia: local infiltration  Local anesthetic: lidocaine 2% no epinephrine  Anesthetic total: 4 ml  Complexity: complex Blunt dissection to break up loculations  Drainage: purulent  Drainage amount: copious   Packing material: 1/4 in iodoform gauze  Patient tolerance: Patient tolerated the procedure well with no immediate complications.      1. Bartholin's gland abscess       MDM  Patient with hx of left bartholin's glad abscess, s/p marsupialization July 2012 and 2 I&Ds in the past 10 days.  Performed I&D here with copious amount of discharge, wound culture sent (patient is currently on Augmentin).  Pt has appointment scheduled with gynecologist on Monday.  Pt reports great relief following I&D.  Pt d/c home with gyn follow up in 2 days.  Patient verbalizes understanding and agrees with plan.          Dillard Cannon [place], [place] 06/06/11 1530

## 2011-06-06 NOTE — ED Provider Notes (Signed)
Medical screening examination/treatment/procedure(s) were performed by non-physician practitioner and as supervising physician I was immediately available for consultation/collaboration.  Flint Melter, MD 06/06/11 [phone]

## 2011-06-08 ENCOUNTER — Encounter (INDEPENDENT_AMBULATORY_CARE_PROVIDER_SITE_OTHER): Payer: 59 | Admitting: Obstetrics and Gynecology

## 2011-06-08 DIAGNOSIS — N751 Abscess of Bartholin's gland: Secondary | ICD-10-CM

## 2011-06-12 LAB — CULTURE, ROUTINE-ABSCESS: Culture: NO GROWTH

## 2011-06-18 ENCOUNTER — Encounter (INDEPENDENT_AMBULATORY_CARE_PROVIDER_SITE_OTHER): Payer: 59 | Admitting: Obstetrics and Gynecology

## 2011-06-18 DIAGNOSIS — N750 Cyst of Bartholin's gland: Secondary | ICD-10-CM

## 2011-06-18 DIAGNOSIS — Z01419 Encounter for gynecological examination (general) (routine) without abnormal findings: Secondary | ICD-10-CM

## 2011-07-06 ENCOUNTER — Other Ambulatory Visit: Payer: Self-pay | Admitting: Obstetrics and Gynecology

## 2011-07-09 ENCOUNTER — Other Ambulatory Visit: Payer: Self-pay | Admitting: Obstetrics and Gynecology

## 2011-07-09 ENCOUNTER — Telehealth: Payer: Self-pay | Admitting: *Deleted

## 2011-07-09 NOTE — Telephone Encounter (Signed)
Spoke with pt to advise her paperwork has been placed at the front desk for pick up, pt understood

## 2011-07-14 ENCOUNTER — Encounter (HOSPITAL_COMMUNITY): Payer: Self-pay | Admitting: Pharmacist

## 2011-07-22 NOTE — H&P (Signed)
Carrie Diaz is an 26 y.o. female. Who is presenting for bartholins cyst gland removal.  Pt had marsupilization 7/12.  Six months later she had a recurrence of bartholins abscess and it was drained three times.  Pt now desires removal  Pertinent Gynecological History: Menses: flow is moderate Bleeding: normal Contraception: none DES exposure: unknown Blood transfusions: none Sexually transmitted diseases: no past history Previous GYN Procedures: DNC and hysteroscopy Marsupilization of bartholins cyst  Last mammogram: NA Date:  Last pap: normal Date: 1/12 OB History: G0, P0   Menstrual History: Menarche age: 66 No LMP recorded.    Past Medical History  Diagnosis Date  . Mental disorder     bi-polar  . Anxiety     has prn med for anxiety  . GERD (gastroesophageal reflux disease)   . Bipolar disorder   . Vaginal cyst     Past Surgical History  Procedure Date  . No past surgeries   . Vein ligation and stripping     Family History  Problem Relation Age of Onset  . Depression Mother     AND ANXIETY  . Alcohol abuse Other   . Depression Other   . Diabetes Other     Social History:  reports that she has never smoked. She does not have any smokeless tobacco history on file. She reports that she drinks about 1.2 ounces of alcohol per week. She reports that she does not use illicit drugs.  Allergies: No Known Allergies  No prescriptions prior to admission    ROS  There were no vitals taken for this visit. Physical Exam  No results found for this or any previous visit (from the past 24 hour(s)).  No results found. Physical Examination: Neck - supple, no significant adenopathy Chest - clear to auscultation, no wheezes, rales or rhonchi, symmetric air entry Heart - normal rate, regular rhythm, normal S1, S2, no murmurs, rubs, clicks or gallops Abdomen - soft, nontender, nondistended, no masses or organomegaly Pelvic - normal external genitalia, , vagina, cervix,  uterus and adnexa, small bartholin cyst on left labium Back exam - full range of motion, no tenderness, palpable spasm or pain on motion Musculoskeletal - no joint tenderness, deformity or swelling Extremities - peripheral pulses normal, no pedal edema, no clubbing or cyanosis Skin - normal coloration and turgor, no rashes, no suspicious skin lesions noted  Assessment/Plan: Recurrent bartholins abscess.  H/O marsupilizaton Pt desires removal of the gland.  She understands the risk to be but not limited to bleeding, infection, vulvar hematoma, chronic pain, disfigurement or dysparuneia  Suzie Vandam A 07/22/2011, 11:58 PM

## 2011-07-23 ENCOUNTER — Encounter (HOSPITAL_COMMUNITY)
Admission: RE | Disposition: A | Discharge status: Home / Self Care | Payer: Self-pay | Source: Ambulatory Visit | Attending: Obstetrics and Gynecology

## 2011-07-23 ENCOUNTER — Encounter (HOSPITAL_COMMUNITY): Payer: Self-pay | Admitting: *Deleted

## 2011-07-23 ENCOUNTER — Ambulatory Visit (HOSPITAL_COMMUNITY): Payer: 59 | Admitting: Anesthesiology

## 2011-07-23 ENCOUNTER — Encounter (HOSPITAL_COMMUNITY): Payer: Self-pay | Admitting: Anesthesiology

## 2011-07-23 ENCOUNTER — Telehealth: Payer: Self-pay

## 2011-07-23 ENCOUNTER — Ambulatory Visit (HOSPITAL_COMMUNITY)
Admission: RE | Admit: 2011-07-23 | Discharge: 2011-07-23 | Disposition: A | Discharge status: Home / Self Care | Payer: 59 | Source: Ambulatory Visit | Attending: Obstetrics and Gynecology | Admitting: Obstetrics and Gynecology

## 2011-07-23 DIAGNOSIS — Z9889 Other specified postprocedural states: Secondary | ICD-10-CM

## 2011-07-23 DIAGNOSIS — N750 Cyst of Bartholin's gland: Secondary | ICD-10-CM

## 2011-07-23 DIAGNOSIS — N751 Abscess of Bartholin's gland: Secondary | ICD-10-CM | POA: Insufficient documentation

## 2011-07-23 HISTORY — PX: BARTHOLIN CYST MARSUPIALIZATION: SHX5383

## 2011-07-23 LAB — CBC
HCT: 38.8 % (ref 36.0–46.0)
Hemoglobin: 12.9 g/dL (ref 12.0–15.0)
MCHC: 33.2 g/dL (ref 30.0–36.0)
RBC: 4.37 MIL/uL (ref 3.87–5.11)
WBC: 5.5 10*3/uL (ref 4.0–10.5)

## 2011-07-23 LAB — PREGNANCY, URINE: Preg Test, Ur: NEGATIVE

## 2011-07-23 SURGERY — MARSUPIALIZATION, CYST, BARTHOLIN'S GLAND
Anesthesia: General | Site: Vulva | Wound class: Clean Contaminated

## 2011-07-23 MED ORDER — FAMOTIDINE 20 MG PO TABS: 20.0000 mg | ORAL_TABLET | Freq: Once | ORAL | Status: DC | PRN

## 2011-07-23 MED ORDER — CITRIC ACID-SODIUM CITRATE 334-500 MG/5ML PO SOLN: 30.0000 mL | Freq: Once | ORAL | Status: DC | PRN

## 2011-07-23 MED ORDER — FENTANYL CITRATE 0.05 MG/ML IJ SOLN: 25.0000 ug | INTRAMUSCULAR | Status: DC | PRN

## 2011-07-23 MED ORDER — LIDOCAINE HCL 2 % IJ SOLN
INTRAMUSCULAR | Status: AC
  Filled 2011-07-23: qty 1

## 2011-07-23 MED ORDER — DEXAMETHASONE SODIUM PHOSPHATE 10 MG/ML IJ SOLN
INTRAMUSCULAR | Status: DC | PRN
  Administered 2011-07-23: 10 mg via INTRAVENOUS

## 2011-07-23 MED ORDER — PROPOFOL 10 MG/ML IV EMUL
INTRAVENOUS | Status: DC | PRN
  Administered 2011-07-23: 200 mg via INTRAVENOUS

## 2011-07-23 MED ORDER — KETOROLAC TROMETHAMINE 30 MG/ML IJ SOLN
INTRAMUSCULAR | Status: DC | PRN
  Administered 2011-07-23: 30 mg via INTRAVENOUS

## 2011-07-23 MED ORDER — LACTATED RINGERS IV SOLN
INTRAVENOUS | Status: DC
  Administered 2011-07-23 (×2): via INTRAVENOUS

## 2011-07-23 MED ORDER — SCOPOLAMINE 1 MG/3DAYS TD PT72: 1.0000 | MEDICATED_PATCH | Freq: Once | TRANSDERMAL | Status: DC | PRN

## 2011-07-23 MED ORDER — PROPOFOL 10 MG/ML IV EMUL
INTRAVENOUS | Status: AC
  Filled 2011-07-23: qty 20

## 2011-07-23 MED ORDER — MIDAZOLAM HCL 2 MG/2ML IJ SOLN
INTRAMUSCULAR | Status: AC
  Filled 2011-07-23: qty 2

## 2011-07-23 MED ORDER — MIDAZOLAM HCL 5 MG/5ML IJ SOLN
INTRAMUSCULAR | Status: DC | PRN
  Administered 2011-07-23: 2 mg via INTRAVENOUS

## 2011-07-23 MED ORDER — KETOROLAC TROMETHAMINE 30 MG/ML IJ SOLN
INTRAMUSCULAR | Status: AC
  Filled 2011-07-23: qty 1

## 2011-07-23 MED ORDER — PANTOPRAZOLE SODIUM 40 MG PO TBEC: 40.0000 mg | DELAYED_RELEASE_TABLET | Freq: Once | ORAL | Status: DC | PRN

## 2011-07-23 MED ORDER — FENTANYL CITRATE 0.05 MG/ML IJ SOLN
INTRAMUSCULAR | Status: DC | PRN
  Administered 2011-07-23: 100 ug via INTRAVENOUS
  Administered 2011-07-23 (×2): 50 ug via INTRAVENOUS

## 2011-07-23 MED ORDER — LIDOCAINE HCL (CARDIAC) 20 MG/ML IV SOLN
INTRAVENOUS | Status: DC | PRN
  Administered 2011-07-23: 60 mg via INTRAVENOUS

## 2011-07-23 MED ORDER — KETOROLAC TROMETHAMINE 30 MG/ML IJ SOLN: 15.0000 mg | Freq: Once | INTRAMUSCULAR | Status: DC | PRN

## 2011-07-23 MED ORDER — LIDOCAINE HCL 2 % IJ SOLN
INTRAMUSCULAR | Status: DC | PRN
  Administered 2011-07-23: 12 mL

## 2011-07-23 MED ORDER — ONDANSETRON HCL 4 MG/2ML IJ SOLN
INTRAMUSCULAR | Status: DC | PRN
  Administered 2011-07-23: 4 mg via INTRAVENOUS

## 2011-07-23 MED ORDER — HYDROCODONE-ACETAMINOPHEN 5-500 MG PO TABS: 1.0000 | ORAL_TABLET | Freq: Four times a day (QID) | ORAL | Status: AC | PRN

## 2011-07-23 MED ORDER — FENTANYL CITRATE 0.05 MG/ML IJ SOLN
INTRAMUSCULAR | Status: AC
  Filled 2011-07-23: qty 2

## 2011-07-23 MED ORDER — DEXAMETHASONE SODIUM PHOSPHATE 10 MG/ML IJ SOLN
INTRAMUSCULAR | Status: AC
  Filled 2011-07-23: qty 1

## 2011-07-23 MED ORDER — LIDOCAINE HCL (CARDIAC) 20 MG/ML IV SOLN
INTRAVENOUS | Status: AC
  Filled 2011-07-23: qty 5

## 2011-07-23 MED ORDER — ONDANSETRON HCL 4 MG/2ML IJ SOLN
INTRAMUSCULAR | Status: AC
  Filled 2011-07-23: qty 2

## 2011-07-23 SURGICAL SUPPLY — 35 items
BLADE SURG 11 STRL SS (BLADE) ×2 IMPLANT
BLADE SURG 15 STRL LF C SS BP (BLADE) ×1 IMPLANT
BLADE SURG 15 STRL SS (BLADE) ×1
CATH ROBINSON RED A/P 16FR (CATHETERS) ×2 IMPLANT
CLOTH BEACON ORANGE TIMEOUT ST (SAFETY) ×2 IMPLANT
CONTAINER PREFILL 10% NBF 15ML (MISCELLANEOUS) ×2 IMPLANT
COUNTER NEEDLE 1200 MAGNETIC (NEEDLE) ×2 IMPLANT
DECANTER SPIKE VIAL GLASS SM (MISCELLANEOUS) ×2 IMPLANT
ELECT REM PT RETURN 9FT ADLT (ELECTROSURGICAL) ×2
ELECTRODE REM PT RTRN 9FT ADLT (ELECTROSURGICAL) ×1 IMPLANT
GLOVE BIO SURGEON STRL SZ7 (GLOVE) ×6 IMPLANT
GLOVE BIOGEL PI IND STRL 7.0 (GLOVE) ×3 IMPLANT
GLOVE BIOGEL PI IND STRL 7.5 (GLOVE) ×1 IMPLANT
GLOVE BIOGEL PI INDICATOR 7.0 (GLOVE) ×3
GLOVE BIOGEL PI INDICATOR 7.5 (GLOVE) ×1
GLOVE ECLIPSE 6.5 STRL STRAW (GLOVE) ×4 IMPLANT
GLOVE SURG SS PI 7.0 STRL IVOR (GLOVE) ×2 IMPLANT
GOWN PREVENTION PLUS LG XLONG (DISPOSABLE) ×8 IMPLANT
NEEDLE HYPO 25X1 1.5 SAFETY (NEEDLE) ×2 IMPLANT
NS IRRIG 1000ML POUR BTL (IV SOLUTION) ×2 IMPLANT
PACK VAGINAL MINOR WOMEN LF (CUSTOM PROCEDURE TRAY) ×2 IMPLANT
PAD PREP 24X48 CUFFED NSTRL (MISCELLANEOUS) ×2 IMPLANT
PENCIL BUTTON HOLSTER BLD 10FT (ELECTRODE) ×2 IMPLANT
SUT CHROMIC 3 0 PS 2 (SUTURE) ×6 IMPLANT
SUT VIC AB 2-0 SH 27 (SUTURE) ×1
SUT VIC AB 2-0 SH 27XBRD (SUTURE) ×1 IMPLANT
SUT VIC AB 3-0 PS2 18 (SUTURE) ×8 IMPLANT
SUT VIC AB 4-0 PS2 27 (SUTURE) IMPLANT
SWAB CULTURE LIQ STUART DBL (MISCELLANEOUS) IMPLANT
SYR CONTROL 10ML LL (SYRINGE) ×2 IMPLANT
TOWEL OR 17X24 6PK STRL BLUE (TOWEL DISPOSABLE) ×4 IMPLANT
TUBE ANAEROBIC SPECIMEN COL (MISCELLANEOUS) IMPLANT
TUBING NON-CON 1/4 X 20 CONN (TUBING) ×2 IMPLANT
WATER STERILE IRR 1000ML POUR (IV SOLUTION) ×2 IMPLANT
YANKAUER SUCT BULB TIP NO VENT (SUCTIONS) ×2 IMPLANT

## 2011-07-23 NOTE — Telephone Encounter (Signed)
Message copied by Rolla Plate on Thu Jul 23, 2011  9:22 AM ------      Message from: Jaymes Graff      Created: Thu Jul 23, 2011 12:10 AM       Please schedule colpo at post op visit in two weeks

## 2011-07-23 NOTE — Discharge Instructions (Signed)
Bartholin's Cyst or Abscess Bartholin's glands are small glands located within the folds of skin (labia) along the sides of the lower opening of the vagina (birth canal). A cyst may develop when the duct of the gland becomes blocked. When this happens, fluid that accumulates within the cyst can become infected. This is known as an abscess. The Bartholin gland produces a mucous fluid to lubricate the outside of the vagina during sexual intercourse. SYMPTOMS   Patients with a small cyst may not have any symptoms.   Mild discomfort to severe pain depending on the size of the cyst and if it is infected (abscess).   Pain, redness, and swelling around the lower opening of the vagina.   Painful intercourse.   Pressure in the perineal area.   Swelling of the lips of the vagina (labia).   The cyst or abscess can be on one side or both sides of the vagina.  DIAGNOSIS   A large swelling is seen in the lower vagina area by your caregiver.   Painful to touch.   Redness and pain, if it is an abscess.  TREATMENT   Sometimes the cyst will go away on its own.   Apply warm wet compresses to the area or take hot sitz baths several times a day.   An incision to drain the cyst or abscess with local anesthesia.   Culture the pus, if it is an abscess.   Antibiotic treatment, if it is an abscess.   Cut open the gland and suture the edges to make the opening of the gland bigger (marsupialization).   Remove the whole gland if the cyst or abscess returns.  PREVENTION   Practice good hygiene.   Clean the vaginal area with a mild soap and soft cloth when bathing.   Do not rub hard in the vaginal area when bathing.   Protect the crotch area with a padded cushion if you take long bike rides or ride horses.   Be sure you are well lubricated when you have sexual intercourse.  HOME CARE INSTRUCTIONS   If your cyst or abscess was opened, a small piece of gauze, or a drain, may have been placed in  the wound to allow drainage. Do not remove this gauze or drain unless directed by your caregiver.   Wear feminine pads, not tampons, as needed for any drainage or bleeding.   If antibiotics were prescribed, take them exactly as directed. Finish the entire course.   Only take over-the-counter or prescription medicines for pain, discomfort, or fever as directed by your caregiver.  SEEK IMMEDIATE MEDICAL CARE IF:   You have an increase in pain, redness, swelling, or drainage.   You have bleeding from the wound which results in the use of more than the number of pads suggested by your caregiver in 24 hours.   You have chills.   You have a fever.   You develop any new problems (symptoms) or aggravation of your existing condition.  MAKE SURE YOU:   Understand these instructions.   Will watch your condition.   Will get help right away if you are not doing well or get worse.  Document Released: 03/23/2005 Document Revised: 03/12/2011 Document Reviewed: 11/09/2007 [employer] Patient Information 2012 [employer]. 

## 2011-07-23 NOTE — Interval H&P Note (Signed)
History and Physical Interval Note:  07/23/2011 9:44 AM  Hosanna Below  has presented today for surgery, with the diagnosis of History of recurrent absess after marsupligation  The various methods of treatment have been discussed with the patient and family. After consideration of risks, benefits and other options for treatment, the patient has consented to  Procedure(s) (LRB): BARTHOLIN CYST MARSUPIALIZATION (N/A) as a surgical intervention .  The patients' history has been reviewed, patient examined, no change in status, stable for surgery.  I have reviewed the patients' chart and labs.  Questions were answered to the patient's satisfaction.     [facility] A  Date of Initial H&P: [id number]  History reviewed, patient examined, no change in status, stable for surgery.

## 2011-07-23 NOTE — Anesthesia Postprocedure Evaluation (Signed)
Anesthesia Post Note  Patient: Carrie Diaz  Procedure(s) Performed: Procedure(s) (LRB): BARTHOLIN CYST MARSUPIALIZATION (N/A)  Anesthesia type: General  Patient location: PACU  Post pain: Pain level controlled  Post assessment: Post-op Vital signs reviewed  Last Vitals:  Filed Vitals:   07/23/11 1145  BP: 112/71  Pulse: 72  Temp: 37 C  Resp: 15    Post vital signs: Reviewed  Level of consciousness: sedated  Complications: No apparent anesthesia complications

## 2011-07-23 NOTE — Telephone Encounter (Signed)
Tc to pt to inform will do colpo at post op visit . Lm on vm tcb rgd appt.

## 2011-07-23 NOTE — Anesthesia Preprocedure Evaluation (Signed)
Anesthesia Evaluation  Patient identified by MRN, date of birth, ID band Patient awake    Reviewed: Allergy & Precautions, H&P , NPO status , Patient's Chart, lab work & pertinent test results, reviewed documented beta blocker date and time   History of Anesthesia Complications Negative for: history of anesthetic complications  Airway Mallampati: II TM Distance: >3 FB Neck ROM: full    Dental  (+) Teeth Intact   Pulmonary neg pulmonary ROS,  breath sounds clear to auscultation  Pulmonary exam normal       Cardiovascular negative cardio ROS  Rhythm:regular Rate:Normal     Neuro/Psych PSYCHIATRIC DISORDERS (bipolar, multiple meds) negative neurological ROS     GI/Hepatic negative GI ROS, Neg liver ROS,   Endo/Other  negative endocrine ROS  Renal/GU negative Renal ROS  negative genitourinary   Musculoskeletal   Abdominal   Peds  Hematology negative hematology ROS (+)   Anesthesia Other Findings   Reproductive/Obstetrics negative OB ROS                           Anesthesia Physical Anesthesia Plan  ASA: I  Anesthesia Plan: General LMA   Post-op Pain Management:    Induction:   Airway Management Planned:   Additional Equipment:   Intra-op Plan:   Post-operative Plan:   Informed Consent: I have reviewed the patients History and Physical, chart, labs and discussed the procedure including the risks, benefits and alternatives for the proposed anesthesia with the patient or authorized representative who has indicated his/her understanding and acceptance.   Dental Advisory Given  Plan Discussed with: CRNA and Surgeon  Anesthesia Plan Comments:         Anesthesia Quick Evaluation

## 2011-07-23 NOTE — Anesthesia Postprocedure Evaluation (Signed)
Anesthesia Post Note  Patient: Carrie Diaz  Procedure(s) Performed: Procedure(s) (LRB): BARTHOLIN CYST MARSUPIALIZATION (N/A)  Anesthesia type: General  Patient location: PACU  Post pain: Pain level controlled  Post assessment: Post-op Vital signs reviewed  Last Vitals:  Filed Vitals:   07/23/11 1145  BP: 112/71  Pulse: 72  Temp: 37 C  Resp: 15    Post vital signs: Reviewed  Level of consciousness: sedated  Complications: No apparent anesthesia complicationsfj

## 2011-07-23 NOTE — Transfer of Care (Signed)
Immediate Anesthesia Transfer of Care Note  Patient: Carrie Diaz  Procedure(s) Performed: Procedure(s) (LRB): BARTHOLIN CYST MARSUPIALIZATION (N/A)  Patient Location: PACU  Anesthesia Type: General  Level of Consciousness: sedated  Airway & Oxygen Therapy: Patient Spontanous Breathing and Patient connected to nasal cannula oxygen  Post-op Assessment: Report given to PACU RN and Post -op Vital signs reviewed and stable  Post vital signs: stable  Complications: No apparent anesthesia complications

## 2011-07-23 NOTE — Op Note (Addendum)
07/23/2011  11:07 AM  PATIENT:  Carrie Diaz  26 y.o. female  PRE-OPERATIVE DIAGNOSIS:  History of recurrent bartholin absess after marsupialization  POST-OPERATIVE DIAGNOSIS:  same  PROCEDURE:  Procedure(s) (LRB): BARTHOLIN CYST GLAND REMOVAL  SURGEON:  Surgeon(s) and Role:Gradyn Shein    * [name], MD - Primary  PHYSICIAN ASSISTANT:   ASSISTANTS: none   ANESTHESIA:   general and LMA with local  EBL:  Total I/O In: 1300 [I.V.:1300] Out: 125 [Urine:50; Blood:75]  BLOOD ADMINISTERED:none  DRAINS: none   LOCAL MEDICATIONS USED:  LIDOCAINE  and Amount: 12 ml  SPECIMEN:  Source of Specimen:  left bartholin gland  DISPOSITION OF SPECIMEN:  PATHOLOGY  COUNTS:  YES  TOURNIQUET:  * No tourniquets in log *  DICTATION: .Dragon Dictation  PLAN OF CARE: Discharge to home after PACU  PATIENT DISPOSITION:  PACU - hemodynamically stable.   Delay start of Pharmacological VTE agent (>24hrs) due to surgical blood loss or risk of bleeding: not applicable Procedure in detail: The patient was taken to the operating room placed in dorsal dorsal lithotomy position and prepped and draped in a normal sterile fashion. Catheter was used to drain the bladder a 50 cc clear urine. The patient was examined and noted to have a Bartholin's gland cyst and the left labium majora.  After exam it was easier to remove the gland from the just outside the hymenal  on the left labium majora.  Total of 12 cc 2% lidocaine was used for local anesthesia.  A vertical incision was then made with the scalpel just outside the hymenal ring.  Tissue is about 2 cm long. With blunt and sharp dissection using Metzenbaums and Bovie the gland was removed in its entirety. The cyst wall was opened and we could see a clear milky fluid, abscess which indicated that the entire gland had been removed. In some areas they were made hemostatic with Bovie cautery. As of buttonhole area leading directly into the vagina. A buttonhole  area the vagina was closed using a running stitch of 3-0 Vicryl.  A rectal exam was done. The rectum was intact without injury. The defect left from the removal of the gland was reapproximated in layers using figure of 8  Interrupted sutures of 3-0 Vicryl.  The skin was reapproximated using 3-0 chromic in a subcuticular fashion.  Sponge lap and needle counts were correct patient was recovered in stable condition

## 2011-07-24 NOTE — Telephone Encounter (Signed)
Pt informed of colpo appt per ND

## 2011-07-27 ENCOUNTER — Encounter (HOSPITAL_COMMUNITY): Payer: Self-pay | Admitting: Obstetrics and Gynecology

## 2011-07-28 ENCOUNTER — Telehealth: Payer: Self-pay | Admitting: Obstetrics and Gynecology

## 2011-07-28 NOTE — Telephone Encounter (Signed)
Spoke with pt rgd msg pt had procedure done 07/23/11 want rf on Vicodin advised pt need ov for addition rx offered pt an appt pt declined appt @ this time pt states she will try alternating tylenol and ibuprofen advised pt if gets worse call office for eval pt voice understanding.

## 2011-08-07 DIAGNOSIS — N750 Cyst of Bartholin's gland: Secondary | ICD-10-CM

## 2011-08-10 ENCOUNTER — Encounter: Payer: Self-pay | Admitting: Obstetrics and Gynecology

## 2011-08-10 ENCOUNTER — Ambulatory Visit (INDEPENDENT_AMBULATORY_CARE_PROVIDER_SITE_OTHER): Payer: 59 | Admitting: Obstetrics and Gynecology

## 2011-08-10 VITALS — BP 120/80 | Ht 68.0 in | Wt 166.0 lb

## 2011-08-10 DIAGNOSIS — O9989 Other specified diseases and conditions complicating pregnancy, childbirth and the puerperium: Secondary | ICD-10-CM

## 2011-08-10 DIAGNOSIS — IMO0001 Reserved for inherently not codable concepts without codable children: Secondary | ICD-10-CM

## 2011-08-10 DIAGNOSIS — Q899 Congenital malformation, unspecified: Secondary | ICD-10-CM

## 2011-08-10 DIAGNOSIS — R87612 Low grade squamous intraepithelial lesion on cytologic smear of cervix (LGSIL): Secondary | ICD-10-CM

## 2011-08-10 DIAGNOSIS — IMO0002 Reserved for concepts with insufficient information to code with codable children: Secondary | ICD-10-CM

## 2011-08-10 DIAGNOSIS — N949 Unspecified condition associated with female genital organs and menstrual cycle: Secondary | ICD-10-CM

## 2011-08-10 DIAGNOSIS — N750 Cyst of Bartholin's gland: Secondary | ICD-10-CM

## 2011-08-10 DIAGNOSIS — O26899 Other specified pregnancy related conditions, unspecified trimester: Secondary | ICD-10-CM

## 2011-08-10 DIAGNOSIS — G8918 Other acute postprocedural pain: Secondary | ICD-10-CM

## 2011-08-10 NOTE — Progress Notes (Addendum)
DATE OR SURGERY: 07/23/11 PAIN:Yes tender VAG BLEEDING: no VAG DISCHARGE: yes NORMAL GI FUNCTN: yes NORMAL GU FUNCTN: yes  Per protocol pt given 600mg  ibuprofen @8 :40am  Subjective:     Carrie Diaz presents to the clinic 2 weeks following removal of left bartholins cysr. Eating a regular diet without difficulty. Bowel movements are Normal.  Pain is controlled without any medications..    Objective:    BP 120/80  Ht 5\' 8"  (1.727 m)  Wt 166 lb (75.297 kg)  BMI 25.24 kg/m2  LMP 07/23/2011  General:  alert and cooperative  Abdomen: soft, bowel sounds active, non-tender  Incision:   healing well, no drainage, no erythema, no hernia, no seroma, no swelling, well approximated, some granulation tissue seen, no dehiscence, incision well approximated     Assessment:    Doing well postoperatively. path is benign    Plan:    1. Continue any current medications. 2. Wound care discussed. 3. Pt is to increase activities as tolerated. No IC for two weeks 4. Follow up: for pap

## 2011-08-10 NOTE — Progress Notes (Signed)
Patient ID: Carrie Diaz, female   DOB: 03/08/1986, 26 y.o.   MRN: [id number]  No chief complaint on file.   HPI Carrie Diaz is a 26 y.o. female.  For colpo HPI  Indications: Pap smear on March 2013 showed: low-grade squamous intraepithelial neoplasia (LGSIL - encompassing HPV,mild dysplasia,CIN I). Previous colposcopy: HPV related changes and in . Prior cervical treatment: no treatment.  Past Medical History  Diagnosis Date  . GERD (gastroesophageal reflux disease)   . Vaginal cyst   . Mental disorder     bi-polar  . Bipolar disorder   . Anxiety     has prn med for anxiety  . H/O varicella   . Yeast infection   . Cyst of Bartholin's gland 05/02/10  . H/O dysmenorrhea 05/02/10  . LGSIL (low grade squamous intraepithelial lesion) on Pap smear 06/04/11    HPV/MILD DYSPLASIA/CIN1    Past Surgical History  Procedure Date  . No past surgeries   . Vein ligation and stripping   . Bartholin cyst marsupialization 07/23/2011    Procedure: BARTHOLIN CYST MARSUPIALIZATION;  Surgeon: Michael Litter, MD;  Location: [facility];  Service: Gynecology;  Laterality: N/A;  Removal  . Wisdom tooth extraction     Family History  Problem Relation Age of Onset  . Depression Mother     AND ANXIETY  . Alcohol abuse Other   . Depression Other   . Diabetes Other     Social History History  Substance Use Topics  . Smoking status: Never Smoker   . Smokeless tobacco: Not on file  . Alcohol Use: 1.2 oz/week    2 Cans of beer per week    No Known Allergies  Current Outpatient Prescriptions  Medication Sig Dispense Refill  . amoxicillin-clavulanate (AUGMENTIN) 875-125 MG per tablet Take 1 tablet by mouth 2 (two) times daily.      Marland Kitchen buPROPion (WELLBUTRIN XL) 150 MG 24 hr tablet Take 450 mg by mouth daily.      . carbamazepine (TEGRETOL) 200 MG tablet Take 100 mg by mouth 2 (two) times daily.      . clonazePAM (KLONOPIN) 0.5 MG tablet Take 0.5 mg by mouth 2 (two) times daily as needed. For anxiety       . cyclobenzaprine (FLEXERIL) 10 MG tablet Take 1 tablet (10 mg total) by mouth every 8 (eight) hours as needed for muscle spasms.  30 tablet  1  . lamoTRIgine (LAMICTAL) 200 MG tablet Take 200 mg by mouth at bedtime.        . naproxen sodium (ANAPROX) 220 MG tablet Take 220 mg by mouth as needed. For pain        Review of Systems Review of Systems  Last menstrual period 07/23/2011.  Physical Exam Physical Exam  Data Reviewed yes  Assessment    Procedure Details  The risks and benefits of the procedure and Written informed consent obtained.  Speculum placed in vagina and excellent visualization of cervix achieved, cervix swabbed x 3 with acetic acid solution.  Specimens: biopsy at 6 with ecc  Complications: none.     Plan    Specimens labelled and sent to Pathology. Will call pt with results      Dariel Pellecchia A 08/10/2011, 9:06 AM

## 2011-08-12 LAB — PATHOLOGY

## 2011-08-17 ENCOUNTER — Telehealth: Payer: Self-pay

## 2011-08-17 NOTE — Telephone Encounter (Signed)
Spoke with pt rgd results informed colpo consistent with pap repeat pap q 6 months for a yr per ND pt voice understanding

## 2011-08-17 NOTE — Telephone Encounter (Signed)
Message copied by Rolla Plate on Mon Aug 17, 2011  9:27 AM ------      Message from: Jaymes Graff      Created: Sat Aug 15, 2011  9:58 PM       Please review colpo results with patient and tell her I recommend a pap every six months for the next year.

## 2011-08-17 NOTE — Telephone Encounter (Signed)
Lm on vm tcb rgd labs 

## 2011-09-16 ENCOUNTER — Telehealth: Payer: Self-pay | Admitting: *Deleted

## 2011-09-16 NOTE — Telephone Encounter (Signed)
Called pt to advise that MD Beverely Low can not fill out the form that she dropped off per she has not had a recent CPE, left vm for pt to call office to set up CPE at her earliest convience

## 2011-09-23 ENCOUNTER — Encounter: Payer: Self-pay | Admitting: Family Medicine

## 2011-09-23 ENCOUNTER — Ambulatory Visit (INDEPENDENT_AMBULATORY_CARE_PROVIDER_SITE_OTHER): Payer: 59 | Admitting: Family Medicine

## 2011-09-23 VITALS — BP 125/76 | HR 70 | Temp 98.6°F | Ht 66.75 in | Wt 166.4 lb

## 2011-09-23 DIAGNOSIS — Z0000 Encounter for general adult medical examination without abnormal findings: Secondary | ICD-10-CM

## 2011-09-23 LAB — HEPATIC FUNCTION PANEL
AST: 17 U/L (ref 0–37)
Albumin: 3.9 g/dL (ref 3.5–5.2)
Alkaline Phosphatase: 54 U/L (ref 39–117)
Total Protein: 7.1 g/dL (ref 6.0–8.3)

## 2011-09-23 LAB — BASIC METABOLIC PANEL
BUN: 9 mg/dL (ref 6–23)
CO2: 27 mEq/L (ref 19–32)
Calcium: 8.7 mg/dL (ref 8.4–10.5)
GFR: 87.82 mL/min (ref >60.00)
Glucose, Bld: 64 mg/dL — ABNORMAL LOW (ref 70–99)
Potassium: 3.8 mEq/L (ref 3.5–5.1)
Sodium: 138 mEq/L (ref 135–145)

## 2011-09-23 LAB — CBC WITH DIFFERENTIAL/PLATELET
Basophils Relative: 0.9 % (ref 0.0–3.0)
Eosinophils Relative: 1 % (ref 0.0–5.0)
HCT: 39.4 % (ref 36.0–46.0)
Lymphs Abs: 1.6 10*3/uL (ref 0.7–4.0)
MCV: 90.6 fl (ref 78.0–100.0)
Monocytes Absolute: 0.3 10*3/uL (ref 0.1–1.0)
Monocytes Relative: 6.3 % (ref 3.0–12.0)
RBC: 4.35 Mil/uL (ref 3.87–5.11)
WBC: 4.6 10*3/uL (ref 4.5–10.5)

## 2011-09-23 LAB — TSH: TSH: 0.93 u[IU]/mL (ref 0.35–5.50)

## 2011-09-23 LAB — LIPID PANEL
HDL: 58.8 mg/dL (ref >39.00)
Total CHOL/HDL Ratio: 3

## 2011-09-23 MED ORDER — VALACYCLOVIR HCL 500 MG PO TABS: 500.0000 mg | ORAL_TABLET | Freq: Every day | ORAL | Status: DC

## 2011-09-23 NOTE — Telephone Encounter (Signed)
Pt seen in office today for CPE and form filled out by MD Beverely Low

## 2011-09-23 NOTE — Progress Notes (Signed)
  Subjective:    Patient ID: Carrie Diaz, female    DOB: 1985/06/03, 26 y.o.   MRN: [id number]  HPI CPE- UTD on GYN.  No concerns today.  Psych- Dr Tomasa Rand   Review of Systems Patient reports no vision/ hearing changes, adenopathy,fever, weight change,  persistant/recurrent hoarseness , swallowing issues, chest pain, palpitations, edema, persistant/recurrent cough, hemoptysis, dyspnea (rest/exertional/paroxysmal nocturnal), gastrointestinal bleeding (melena, rectal bleeding), abdominal pain, significant heartburn, bowel changes, GU symptoms (dysuria, hematuria, incontinence), Gyn symptoms (abnormal  bleeding, pain),  syncope, focal weakness, memory loss, numbness & tingling, skin/hair/nail changes, abnormal bruising or bleeding, anxiety, or depression.     Objective:   Physical Exam General Appearance:    Alert, cooperative, no distress, appears stated age  Head:    Normocephalic, without obvious abnormality, atraumatic  Eyes:    PERRL, conjunctiva/corneas clear, EOM's intact, fundi    benign, both eyes  Ears:    Normal TM's and external ear canals, both ears  Nose:   Nares normal, septum midline, mucosa normal, no drainage    or sinus tenderness  Throat:   Lips, mucosa, and tongue normal; teeth and gums normal  Neck:   Supple, symmetrical, trachea midline, no adenopathy;    Thyroid: no enlargement/tenderness/nodules  Back:     Symmetric, no curvature, ROM normal, no CVA tenderness  Lungs:     Clear to auscultation bilaterally, respirations unlabored  Chest Wall:    No tenderness or deformity   Heart:    Regular rate and rhythm, S1 and S2 normal, no murmur, rub   or gallop  Breast Exam:    Deferred to GYN  Abdomen:     Soft, non-tender, bowel sounds active all four quadrants,    no masses, no organomegaly  Genitalia:    Deferred to GYN  Rectal:    Extremities:   Extremities normal, atraumatic, no cyanosis or edema  Pulses:   2+ and symmetric all extremities  Skin:   Skin color,  texture, turgor normal, no rashes or lesions  Lymph nodes:   Cervical, supraclavicular, and axillary nodes normal  Neurologic:   CNII-XII intact, normal strength, sensation and reflexes    throughout          Assessment & Plan:

## 2011-09-23 NOTE — Assessment & Plan Note (Signed)
Pt's PE WNL.  Check labs.  UTD on GYN.  Anticipatory guidance provided.  

## 2011-09-23 NOTE — Patient Instructions (Addendum)
You look great!  Keep up the good work! We'll notify you of your lab results Call with any questions or concerns Have a great summer!!!

## 2011-09-24 ENCOUNTER — Encounter: Payer: Self-pay | Admitting: *Deleted

## 2011-09-26 LAB — VITAMIN D 1,25 DIHYDROXY
Vitamin D 1, 25 (OH)2 Total: 64 pg/mL (ref 18–72)
Vitamin D2 1, 25 (OH)2: <8 pg/mL

## 2011-09-28 ENCOUNTER — Encounter: Payer: Self-pay | Admitting: *Deleted

## 2011-11-06 ENCOUNTER — Telehealth: Payer: Self-pay | Admitting: *Deleted

## 2011-11-06 ENCOUNTER — Telehealth: Payer: Self-pay | Admitting: Obstetrics and Gynecology

## 2011-11-06 NOTE — Telephone Encounter (Signed)
Discuss with patient who states that she has already spoke to her GYN and they are willing to do it.

## 2011-11-06 NOTE — Telephone Encounter (Signed)
Carrie Diaz/signed

## 2011-11-06 NOTE — Telephone Encounter (Signed)
Spoke with pt rgd msh pt wants rx for sprentec to help with hair loss it helped in the past pt had aex 3/13 with ND advised pt wil consult with ND and call her bak pt voice understanding

## 2011-11-06 NOTE — Telephone Encounter (Signed)
If she sees Gyn they should be the ones to give her bcp since they are doing her paps

## 2011-11-06 NOTE — Telephone Encounter (Signed)
Pt states that she would like to get started on a birth control. Pt notes that this help with her hair loss and she use to take this in the past. Pt notes that she see the GYN and has pap every 6 month. Pt advise Dr Beverely Low out of office until Tuesday but will forward to another provider but also advise Pt to contact GYN to see if they are willing to give her med Pt ok will contact them as well. Please advise

## 2011-11-09 MED ORDER — NORGESTIMATE-ETH ESTRADIOL 0.25-35 MG-MCG PO TABS: 1.0000 | ORAL_TABLET | Freq: Every day | ORAL | Status: DC

## 2011-11-09 NOTE — Telephone Encounter (Signed)
Pt may have sprintec one a day for three months.  Please tell her to start with her menses and make sure she has no h/o VTE.  Give her a three month supply and f/u in three months.  Also let her know it may not work and she may need to see a Armed forces operational officer.

## 2011-11-09 NOTE — Telephone Encounter (Signed)
Spoke with pt informed rx sent to pharm f/u three months if not better will refer to dermatologist pt voice understanding

## 2011-12-03 ENCOUNTER — Ambulatory Visit (INDEPENDENT_AMBULATORY_CARE_PROVIDER_SITE_OTHER): Payer: 59 | Admitting: Family Medicine

## 2011-12-03 VITALS — BP 112/78 | HR 103 | Temp 98.9°F | Resp 18 | Ht 67.0 in | Wt 169.0 lb

## 2011-12-03 DIAGNOSIS — J029 Acute pharyngitis, unspecified: Secondary | ICD-10-CM

## 2011-12-03 DIAGNOSIS — R05 Cough: Secondary | ICD-10-CM

## 2011-12-03 DIAGNOSIS — R110 Nausea: Secondary | ICD-10-CM

## 2011-12-03 DIAGNOSIS — J069 Acute upper respiratory infection, unspecified: Secondary | ICD-10-CM

## 2011-12-03 DIAGNOSIS — R059 Cough, unspecified: Secondary | ICD-10-CM

## 2011-12-03 LAB — POCT RAPID STREP A (OFFICE): Rapid Strep A Screen: NEGATIVE

## 2011-12-03 NOTE — Progress Notes (Signed)
[facility]:  Office Visit  Chief Complaint:  Chief Complaint  Patient presents with  . Sore Throat    started yesterday  . Generalized Body Aches    HPI: Carrie Diaz is a 26 y.o. female who complains of  Sore throat x 1 day, generalized aches, chills, intermittent nausea. Works in jail, [employer].  No sick contacts that she knows of. Denies CP, SOB, chest congestion. Denies being pregnant  Past Medical History  Diagnosis Date  . GERD (gastroesophageal reflux disease)   . Vaginal cyst   . Mental disorder     bi-polar  . Bipolar disorder   . Anxiety     has prn med for anxiety  . H/O varicella   . Yeast infection   . Cyst of Bartholin's gland 05/02/10  . H/O dysmenorrhea 05/02/10  . LGSIL (low grade squamous intraepithelial lesion) on Pap smear 06/04/11    HPV/MILD DYSPLASIA/CIN1   Past Surgical History  Procedure Date  . No past surgeries   . Vein ligation and stripping   . Bartholin cyst marsupialization 07/23/2011    Procedure: BARTHOLIN CYST MARSUPIALIZATION;  Surgeon: Michael Litter, MD;  Location: [facility];  Service: Gynecology;  Laterality: N/A;  Removal  . Wisdom tooth extraction    History   Social History  . Marital Status: Single    Spouse Name: N/A    Number of Children: N/A  . Years of Education: N/A   Occupational History  . [employer]    Social History Main Topics  . Smoking status: Never Smoker   . Smokeless tobacco: None  . Alcohol Use: 1.2 oz/week    2 Cans of beer per week  . Drug Use: No  . Sexually Active: Yes    Birth Control/ Protection: None   Other Topics Concern  . None   Social History Narrative   LIVES ALONEHAS A CAT   Family History  Problem Relation Age of Onset  . Depression Mother     AND ANXIETY  . Alcohol abuse Other   . Depression Other   . Diabetes Other    No Known Allergies Prior to Admission medications   Medication Sig Start Date End Date Taking? Authorizing Provider    buPROPion (WELLBUTRIN XL) 150 MG 24 hr tablet Take 450 mg by mouth daily.   Yes Historical Provider, MD  carbamazepine (TEGRETOL) 200 MG tablet Take 100 mg by mouth 2 (two) times daily.   Yes Historical Provider, MD  clonazePAM (KLONOPIN) 0.5 MG tablet Take 0.5 mg by mouth 2 (two) times daily as needed. For anxiety   Yes Historical Provider, MD  cyclobenzaprine (FLEXERIL) 10 MG tablet Take 1 tablet (10 mg total) by mouth every 8 (eight) hours as needed for muscle spasms. 01/05/11 01/05/12 Yes Sheliah Hatch, MD  HYDROcodone-acetaminophen (VICODIN) 5-500 MG per tablet  07/23/11  Yes Historical Provider, MD  lamoTRIgine (LAMICTAL) 200 MG tablet Take 200 mg by mouth at bedtime.     Yes Historical Provider, MD  naproxen sodium (ANAPROX) 220 MG tablet Take 220 mg by mouth as needed. For pain   Yes Historical Provider, MD  norgestimate-ethinyl estradiol (ORTHO-CYCLEN,SPRINTEC,PREVIFEM) 0.25-35 MG-MCG tablet Take 1 tablet by mouth daily. 11/09/11 11/08/12 Yes Naima A Dillard, MD  valACYclovir (VALTREX) 500 MG tablet Take 1 tablet (500 mg total) by mouth daily. 09/23/11  Yes Sheliah Hatch, MD  zolpidem (AMBIEN) 10 MG tablet  08/17/11  Yes Historical Provider, MD  ROS: The patient denies fevers, chills, night sweats, unintentional weight loss, chest pain, palpitations, wheezing, dyspnea on exertion, vomiting, abdominal pain, dysuria, hematuria, melena, numbness, weakness, or tingling. + nausea  All other systems have been reviewed and were otherwise negative with the exception of those mentioned in the HPI and as above.    PHYSICAL EXAM: Filed Vitals:   12/03/11 1306  BP: 112/78  Pulse: 103  Temp: 98.9 F (37.2 C)  Resp: 18   Filed Vitals:   12/03/11 1306  Height: 5\' 7"  (1.702 m)  Weight: 169 lb (76.658 kg)   Body mass index is 26.47 kg/(m^2).  General: Alert, no acute distress HEENT:  Normocephalic, atraumatic, oropharynx patent. TM nl. Erythematous throat. No exudates Cardiovascular:   Regular rate and rhythm, no rubs murmurs or gallops.  No Carotid bruits, radial pulse intact. No pedal edema.  Respiratory: Clear to auscultation bilaterally.  No wheezes, rales, or rhonchi.  No cyanosis, no use of accessory musculature GI: No organomegaly, abdomen is soft and non-tender, positive bowel sounds.  No masses. Skin: No rashes. Neurologic: Facial musculature symmetric. No minengeal signs Psychiatric: Patient is appropriate throughout our interaction. Lymphatic: No cervical lymphadenopathy Musculoskeletal: Gait intact.   LABS: Results for orders placed in visit on 12/03/11  POCT RAPID STREP A (OFFICE)      Component Value Range   Rapid Strep A Screen Negative  Negative     EKG/XRAY:   Primary read interpreted by Dr. Conley Rolls at [facility].   ASSESSMENT/PLAN: Encounter Diagnoses  Name Primary?  . Pharyngitis   . Cough   . Nausea   . Viral URI with cough Yes    Sxs treatment Continue with zofran for nausea Work note given for 8/29-8/30. Return to work on 8/31. F/u if worsening sxs   Jadden Yim PHUONG, DO 12/03/2011 1:51 PM

## 2012-02-23 ENCOUNTER — Other Ambulatory Visit: Payer: Self-pay

## 2012-02-23 MED ORDER — CYCLOBENZAPRINE HCL 10 MG PO TABS: 10.0000 mg | ORAL_TABLET | Freq: Three times a day (TID) | ORAL | Status: AC | PRN

## 2012-02-23 NOTE — Telephone Encounter (Signed)
Pt LMOVM requesting Flexeril and asked does she need appt before getting this med? Last filled 01/05/12 OV 09/23/11 PLz advise   MW

## 2012-09-30 ENCOUNTER — Encounter: Payer: Self-pay | Admitting: Family Medicine

## 2012-09-30 ENCOUNTER — Encounter: Payer: Self-pay | Admitting: *Deleted

## 2012-09-30 ENCOUNTER — Ambulatory Visit (INDEPENDENT_AMBULATORY_CARE_PROVIDER_SITE_OTHER): Payer: 59 | Admitting: Family Medicine

## 2012-09-30 VITALS — BP 108/80 | HR 62 | Temp 98.1°F | Ht 67.0 in | Wt 164.2 lb

## 2012-09-30 DIAGNOSIS — Z0000 Encounter for general adult medical examination without abnormal findings: Secondary | ICD-10-CM

## 2012-09-30 LAB — CBC WITH DIFFERENTIAL/PLATELET
Basophils Absolute: 0 10*3/uL (ref 0.0–0.1)
Eosinophils Relative: 1.7 % (ref 0.0–5.0)
Lymphocytes Relative: 36.9 % (ref 12.0–46.0)
Monocytes Relative: 7.3 % (ref 3.0–12.0)
Neutrophils Relative %: 53.3 % (ref 43.0–77.0)
Platelets: 273 10*3/uL (ref 150.0–400.0)
RDW: 13.4 % (ref 11.5–14.6)
WBC: 5.4 10*3/uL (ref 4.5–10.5)

## 2012-09-30 LAB — HEPATIC FUNCTION PANEL
ALT: 10 U/L (ref 0–35)
AST: 13 U/L (ref 0–37)
Alkaline Phosphatase: 56 U/L (ref 39–117)
Bilirubin, Direct: 0 mg/dL (ref 0.0–0.3)
Total Protein: 7.5 g/dL (ref 6.0–8.3)

## 2012-09-30 LAB — BASIC METABOLIC PANEL
CO2: 25 mEq/L (ref 19–32)
Calcium: 9.1 mg/dL (ref 8.4–10.5)
Chloride: 103 mEq/L (ref 96–112)
Glucose, Bld: 93 mg/dL (ref 70–99)
Potassium: 4.3 mEq/L (ref 3.5–5.1)
Sodium: 139 mEq/L (ref 135–145)

## 2012-09-30 LAB — LIPID PANEL
LDL Cholesterol: 107 mg/dL — ABNORMAL HIGH (ref 0–99)
Total CHOL/HDL Ratio: 3
Triglycerides: 85 mg/dL (ref 0.0–149.0)

## 2012-09-30 MED ORDER — CLINDAMYCIN PHOS-BENZOYL PEROX 1.2-5 % EX GEL: 1.0000 "application " | Freq: Two times a day (BID) | CUTANEOUS | Status: DC

## 2012-09-30 NOTE — Patient Instructions (Addendum)
Follow up in 1 year or as needed We'll notify you of your lab results and make any changes if needed Keep up the good work!  You look great! Call with any questions or concerns Have a great summer!!! 

## 2012-09-30 NOTE — Assessment & Plan Note (Signed)
Pt's PE WNL.  UTD on GYN.  Check labs.  Anticipatory guidance provided.  

## 2012-09-30 NOTE — Progress Notes (Signed)
  Subjective:    Patient ID: Carrie Diaz, female    DOB: 01-22-1986, 27 y.o.   MRN: [id number]  HPI CPE- UTD on GYN.  No concerns today.   Review of Systems Patient reports no vision/ hearing changes, adenopathy,fever, weight change,  persistant/recurrent hoarseness , swallowing issues, chest pain, palpitations, edema, persistant/recurrent cough, hemoptysis, dyspnea (rest/exertional/paroxysmal nocturnal), gastrointestinal bleeding (melena, rectal bleeding), abdominal pain, significant heartburn, bowel changes, GU symptoms (dysuria, hematuria, incontinence), Gyn symptoms (abnormal  bleeding, pain),  syncope, focal weakness, memory loss, numbness & tingling, skin/hair/nail changes, abnormal bruising or bleeding, anxiety, or depression.     Objective:   Physical Exam General Appearance:    Alert, cooperative, no distress, appears stated age  Head:    Normocephalic, without obvious abnormality, atraumatic  Eyes:    PERRL, conjunctiva/corneas clear, EOM's intact, fundi    benign, both eyes  Ears:    Normal TM's and external ear canals, both ears  Nose:   Nares normal, septum midline, mucosa normal, no drainage    or sinus tenderness  Throat:   Lips, mucosa, and tongue normal; teeth and gums normal  Neck:   Supple, symmetrical, trachea midline, no adenopathy;    Thyroid: no enlargement/tenderness/nodules  Back:     Symmetric, no curvature, ROM normal, no CVA tenderness  Lungs:     Clear to auscultation bilaterally, respirations unlabored  Chest Wall:    No tenderness or deformity   Heart:    Regular rate and rhythm, S1 and S2 normal, no murmur, rub   or gallop  Breast Exam:    Deferred to GYN  Abdomen:     Soft, non-tender, bowel sounds active all four quadrants,    no masses, no organomegaly  Genitalia:    Deferred to GYN  Rectal:    Extremities:   Extremities normal, atraumatic, no cyanosis or edema  Pulses:   2+ and symmetric all extremities  Skin:   Skin color, texture, turgor normal,  no rashes or lesions  Lymph nodes:   Cervical, supraclavicular, and axillary nodes normal  Neurologic:   CNII-XII intact, normal strength, sensation and reflexes    throughout          Assessment & Plan:

## 2012-10-05 ENCOUNTER — Telehealth: Payer: Self-pay | Admitting: *Deleted

## 2012-10-05 LAB — VITAMIN D 1,25 DIHYDROXY
Vitamin D 1, 25 (OH)2 Total: 28 pg/mL (ref 18–72)
Vitamin D2 1, 25 (OH)2: <8 pg/mL

## 2012-10-05 NOTE — Telephone Encounter (Signed)
Prior auth for clindaymcin/BPO 1.2-5% gel has been initiated.

## 2012-10-06 NOTE — Telephone Encounter (Signed)
Form completed & faxed back.

## 2012-10-12 ENCOUNTER — Telehealth: Payer: Self-pay | Admitting: *Deleted

## 2012-10-12 NOTE — Telephone Encounter (Signed)
Spoke with the pt and informed her that she will need to schedule an nurse visit to have a hearing,vision, and UA checked to conplete the medical form.  Pt agreed and scheduled an appt.//AB/CMA

## 2012-10-14 ENCOUNTER — Ambulatory Visit (INDEPENDENT_AMBULATORY_CARE_PROVIDER_SITE_OTHER): Payer: 59 | Admitting: *Deleted

## 2012-10-14 DIAGNOSIS — Z0000 Encounter for general adult medical examination without abnormal findings: Secondary | ICD-10-CM

## 2012-10-14 LAB — POCT URINALYSIS DIPSTICK
Bilirubin, UA: NEGATIVE
Blood, UA: NEGATIVE
Ketones, UA: NEGATIVE
Nitrite, UA: NEGATIVE
Protein, UA: NEGATIVE
pH, UA: 6.5

## 2012-12-02 ENCOUNTER — Telehealth: Payer: Self-pay | Admitting: *Deleted

## 2012-12-02 NOTE — Telephone Encounter (Signed)
We have trying for two months to get prior authorization for Clindamycin topical cream.  However today I just found out that this medication is a excluded medicine and the patient's insurance will not pay for it.  I have attempted to contact patient to ask if she could get a formulary from her insurance company so that we could find out what is available for her acne treatment.    If patient calls back please advise.  Ag cma

## 2013-04-10 ENCOUNTER — Ambulatory Visit (INDEPENDENT_AMBULATORY_CARE_PROVIDER_SITE_OTHER): Payer: 59 | Admitting: Family Medicine

## 2013-04-10 DIAGNOSIS — Z23 Encounter for immunization: Secondary | ICD-10-CM

## 2013-04-10 DIAGNOSIS — M79609 Pain in unspecified limb: Secondary | ICD-10-CM

## 2013-04-10 DIAGNOSIS — M79631 Pain in right forearm: Secondary | ICD-10-CM | POA: Insufficient documentation

## 2013-04-10 MED ORDER — NAPROXEN 500 MG PO TABS: 500.0000 mg | ORAL_TABLET | Freq: Two times a day (BID) | ORAL | Status: DC

## 2013-04-10 NOTE — Assessment & Plan Note (Signed)
New.  No known injury but repetitive movement on job ([employer]).  Suspect tendonitis.  Start scheduled NSAIDs.  Refer to sports med due to the need for dexterity in her job- particularly when using her gun.

## 2013-04-10 NOTE — Progress Notes (Signed)
   Subjective:    Patient ID: Carrie Diaz, female    DOB: 14-Dec-1985, 28 y.o.   MRN: [id number]  HPI R forearm pain- sxs started ~3 weeks ago.  R hand dominant.  Having pain along wrist and extending into forearm both ulnar and radial sides.  Grip is now weaker.  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     Review of Systems For ROS see HPI     Objective:   Physical Exam  Vitals reviewed. Constitutional: She appears well-developed and well-nourished. No distress.  Cardiovascular: Intact distal pulses.   Musculoskeletal:  No edema + TTP along ventral wrist and forearm both on ulnar and medial sides.  No pain w/ wrist extension.  + discomfort w/ wrist flexion.          Assessment & Plan:

## 2013-04-10 NOTE — Patient Instructions (Signed)
Follow up as needed Start the Naproxen twice daily- w/ food- for 10-14 days ICE! We'll call you with your sports med appt Call with any questions or concerns Happy New Year!

## 2013-04-12 ENCOUNTER — Ambulatory Visit: Payer: 59 | Admitting: Family Medicine

## 2013-04-14 ENCOUNTER — Ambulatory Visit (INDEPENDENT_AMBULATORY_CARE_PROVIDER_SITE_OTHER): Payer: 59 | Admitting: Family Medicine

## 2013-04-14 ENCOUNTER — Encounter: Payer: Self-pay | Admitting: Family Medicine

## 2013-04-14 VITALS — BP 108/64 | HR 75

## 2013-04-14 DIAGNOSIS — G5611 Other lesions of median nerve, right upper limb: Secondary | ICD-10-CM | POA: Insufficient documentation

## 2013-04-14 DIAGNOSIS — M79609 Pain in unspecified limb: Secondary | ICD-10-CM

## 2013-04-14 DIAGNOSIS — G5610 Other lesions of median nerve, unspecified upper limb: Secondary | ICD-10-CM

## 2013-04-14 DIAGNOSIS — M79601 Pain in right arm: Secondary | ICD-10-CM

## 2013-04-14 NOTE — Assessment & Plan Note (Signed)
Patient has what appears to be a pronator syndrome of the right upper extremity. Patient was given a home exercise program Patient was given a brace to wear at night. Patient is unable to wear it during the day secondary to her job. Discussed icing protocol Discussed anti-inflammatory burst Patient come back again in 3 weeks for further evaluation. We can notice consider a potential injection if necessary.

## 2013-04-14 NOTE — Patient Instructions (Signed)
Good to meet you Try the brace at night ICe 20 minutes especially after activity Do exercises most days of the week Take the medicine your PCP gave you for 10 days then as needed.  Come back in 3 week sand we will consider a patch or something else if needed.

## 2013-04-14 NOTE — Progress Notes (Signed)
  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 Patient was going through the extensor workouts and started having some right forearm discomfort. Patient states it is mostly on the dorsal aspect of the wrist it hurts with certain movements. Patient states that if she extends her wrist and rotate she has some discomfort just proximal to her wrist. Patient denies any significant radiation, denies any numbness or tingling but states that it seemed to start to affect her grip strength. Patient states that the grip strength is improving somewhat but still has this mild discomfort. Patient states that she has a hypertrophic for pain so she puts the severity at 3/10 but states that it is affecting her daily activities. Denies any nighttime awakening. Patient was given anti-inflammatories by primary care provider. Patient has taken this a couple times with some mild improvement.  Past medical, surgical, family and social history reviewed. Medications reviewed all in the electronic medical record.   Review of Systems: No headache, visual changes, nausea, vomiting, diarrhea, constipation, dizziness, abdominal pain, skin rash, fevers, chills, night sweats, weight loss, swollen lymph nodes, body aches, joint swelling, muscle aches, chest pain, shortness of breath, mood changes.   Objective:    Blood pressure 108/64, pulse 75, SpO2 98.00%.   General: No apparent distress alert and oriented x3 mood and affect normal, dressed appropriately.  HEENT: Pupils equal, extraocular movements intact Respiratory: Patient's speak in full sentences and does not appear short of breath Cardiovascular: No lower extremity edema, non tender, no erythema Skin: Warm dry intact with no signs of infection or rash on extremities or on axial skeleton. Abdomen: Soft  nontender Neuro: Cranial nerves II through XII are intact, neurovascularly intact in all extremities with 2+ DTRs and 2+ pulses. Lymph: No lymphadenopathy of posterior or anterior cervical chain or axillae bilaterally.  Gait normal with good balance and coordination.  MSK: Non tender with full range of motion and good stability and symmetric strength and tone of shoulders, elbows, hip, knee and ankles bilaterally.  Wrist: Right Inspection normal with no visible erythema or swelling. ROM smooth and normal with good flexion and extension and ulnar/radial deviation that is symmetrical with opposite wrist. Palpation is normal over metacarpals, navicular, lunate, and TFCC; tendons without tenderness/ swelling, patient though does have some tenderness over the pronator musculature. Patient states she does have some mild pain with overpronation. No snuffbox tenderness. No tenderness over Canal of Guyon. Strength 5/5 in all directions without pain. Negative Finkelstein, tinel's and phalens. Negative Watson's test. Contralateral wrist unremarkable  MSK US performed of: Right wrist This study was ordered, performed, and interpreted by Terrilee FilesZach Laker Thompson D.O.  Wrist: All extensor compartments visualized and tendons patient does have some mild hypoechoic changes of the first and second compartment but these seem to be nonspecific the  Patient does have some mild effusion noted of the pronator musculature with what appears to be a tear with some scar tissue. There is increasing Doppler flow. No effusion seen. TFCC intact. Scapholunate ligament intact. Carpal tunnel visualized and median nerve area normal, flexor tendons all normal in appearance without fraying, tears, or sheath effusions.   IMPRESSION:  Wrist tendinitis in the extensor compartments as well as potential pronator muscle tear    Impression and Recommendations:     This case required medical decision making of moderate complexity.

## 2013-05-04 ENCOUNTER — Encounter: Payer: Self-pay | Admitting: Family Medicine

## 2013-05-04 ENCOUNTER — Ambulatory Visit (INDEPENDENT_AMBULATORY_CARE_PROVIDER_SITE_OTHER): Payer: 59 | Admitting: Family Medicine

## 2013-05-04 ENCOUNTER — Other Ambulatory Visit (INDEPENDENT_AMBULATORY_CARE_PROVIDER_SITE_OTHER): Payer: 59

## 2013-05-04 VITALS — BP 106/62 | HR 79 | Temp 98.4°F | Resp 16 | Wt 157.1 lb

## 2013-05-04 DIAGNOSIS — M79631 Pain in right forearm: Secondary | ICD-10-CM

## 2013-05-04 DIAGNOSIS — M79609 Pain in unspecified limb: Secondary | ICD-10-CM

## 2013-05-04 DIAGNOSIS — M654 Radial styloid tenosynovitis [de Quervain]: Secondary | ICD-10-CM

## 2013-05-04 DIAGNOSIS — G5611 Other lesions of median nerve, right upper limb: Secondary | ICD-10-CM

## 2013-05-04 DIAGNOSIS — G5610 Other lesions of median nerve, unspecified upper limb: Secondary | ICD-10-CM

## 2013-05-04 MED ORDER — NITROGLYCERIN 0.2 MG/HR TD PT24
MEDICATED_PATCH | TRANSDERMAL | Status: DC
Start: 2013-05-04 — End: 2014-03-07

## 2013-05-04 NOTE — Progress Notes (Signed)
Pre-visit discussion using our clinic review tool. No additional management support is needed unless otherwise documented below in the visit note.  

## 2013-05-04 NOTE — Assessment & Plan Note (Signed)
Patient pronator muscle seems to be almost healed at this time. Patient was having some compensation with her thumb that is giving her more of a de Quervain's tenosynovitis. Discuss different treatment options and patient elected to tried a nitroglycerin patch. Potential side effects discussed. Patient will continue the home exercises, icing, and bracing at night. Patient come back again in 4 weeks for further followup. She continues to have pain we will try an injection in the first compartment.

## 2013-05-04 NOTE — Progress Notes (Signed)
   CC: Right forearm pain follow up  HPI: Patient is following up for potential pronator tear. Patient has been wearing the brace today and night, doing the exercises as well as icing. Patient continues to do some of her exercises but has not been weight lifting quite as much. Patient states that she is approximately 75% better. Patient denies any numbness or tingling or any new symptoms. Patient states though the pain seems to be localized more towards the thumb nail than it was previously. Patient is sleeping comfortably.  Past medical, surgical, family and social history reviewed. Medications reviewed all in the electronic medical record.   Review of Systems: No headache, visual changes, nausea, vomiting, diarrhea, constipation, dizziness, abdominal pain, skin rash, fevers, chills, night sweats, weight loss, swollen lymph nodes, body aches, joint swelling, muscle aches, chest pain, shortness of breath, mood changes.   Objective:    Blood pressure 106/62, pulse 79, temperature 98.4 F (36.9 C), temperature source Oral, resp. rate 16, weight 157 lb 1.3 oz (71.251 kg), SpO2 98.00%.   General: No apparent distress alert and oriented x3 mood and affect normal, dressed appropriately.  HEENT: Pupils equal, extraocular movements intact Respiratory: Patient's speak in full sentences and does not appear short of breath Cardiovascular: No lower extremity edema, non tender, no erythema Skin: Warm dry intact with no signs of infection or rash on extremities or on axial skeleton. Abdomen: Soft nontender Neuro: Cranial nerves II through XII are intact, neurovascularly intact in all extremities with 2+ DTRs and 2+ pulses. Lymph: No lymphadenopathy of posterior or anterior cervical chain or axillae bilaterally.  Gait normal with good balance and coordination.  MSK: Non tender with full range of motion and good stability and symmetric strength and tone of shoulders, elbows, hip, knee and ankles bilaterally.   Wrist: Right Inspection normal with no visible erythema or swelling. ROM smooth and normal with good flexion and extension and ulnar/radial deviation that is symmetrical with opposite wrist. Palpation is normal over metacarpals, navicular, lunate, and TFCC; tendons without tenderness/ swelling, patient no longer has tenderness over the pronator muscle.  No snuffbox tenderness. No tenderness over Canal of Guyon. Strength 5/5 in all directions without pain. Positive Finkelstein, negative tinel's and phalens. Negative Watson's test. Contralateral wrist unremarkable  MSK US performed of: Right wrist This study was ordered, performed, and interpreted by Terrilee FilesZach Shakai Dolley D.O.  Wrist: All extensor compartments visualized and tendons patient does have some mild hypoechoic changes of the first and second compartment and now more localized over the abductor pollicis longus tendon sheath. Patient does have some mild effusion noted of the pronator musculature but muscle does appear to be normal at this time. Still some increasing Doppler flow. No effusion seen. TFCC intact. Scapholunate ligament intact. Carpal tunnel visualized and median nerve area normal, flexor tendons all normal in appearance without fraying, tears, or sheath effusions.   IMPRESSION:  Continued wrist tendinitis with de Quervain's tenosynovitis    Impression and Recommendations:     This case required medical decision making of moderate complexity.

## 2013-05-04 NOTE — Patient Instructions (Signed)
Good to see you Continue exercises 3-4 times a week Ice still would be great Wear brace at night and only during day if pain  Nitroglycerin Protocol   Apply 1/4 nitroglycerin patch to affected area daily.  Change position of patch within the affected area every 24 hours.  You may experience a headache during the first 1-2 weeks of using the patch, these should subside.  If you experience headaches after beginning nitroglycerin patch treatment, you may take your preferred over the counter pain reliever.  Another side effect of the nitroglycerin patch is skin irritation or rash related to patch adhesive.  Please notify our office if you develop more severe headaches or rash, and stop the patch.  Tendon healing with nitroglycerin patch may require 12 to 24 weeks depending on the extent of injury.  Men should not use if taking Viagra, Cialis, or Levitra.   Do not use if you have migraines or rosacea.   Come back again in 4 weeks.

## 2013-05-09 ENCOUNTER — Other Ambulatory Visit: Payer: Self-pay | Admitting: Family Medicine

## 2013-05-10 NOTE — Telephone Encounter (Signed)
Med filled.  

## 2013-06-01 ENCOUNTER — Ambulatory Visit: Payer: 59 | Admitting: Family Medicine

## 2013-06-01 DIAGNOSIS — Z0289 Encounter for other administrative examinations: Secondary | ICD-10-CM

## 2013-12-24 ENCOUNTER — Other Ambulatory Visit: Payer: Self-pay | Admitting: Family Medicine

## 2013-12-25 NOTE — Telephone Encounter (Signed)
Med filled.  

## 2014-03-07 ENCOUNTER — Ambulatory Visit (INDEPENDENT_AMBULATORY_CARE_PROVIDER_SITE_OTHER): Payer: 59 | Admitting: Family Medicine

## 2014-03-07 ENCOUNTER — Encounter: Payer: Self-pay | Admitting: Family Medicine

## 2014-03-07 VITALS — BP 110/80 | HR 68 | Temp 98.4°F | Resp 16 | Ht 67.0 in | Wt 165.0 lb

## 2014-03-07 DIAGNOSIS — Z0000 Encounter for general adult medical examination without abnormal findings: Secondary | ICD-10-CM

## 2014-03-07 LAB — LIPID PANEL
CHOLESTEROL: 194 mg/dL (ref 0–200)
HDL: 48.9 mg/dL (ref >39.00)
LDL CALC: 130 mg/dL — AB (ref 0–99)
NonHDL: 145.1
TRIGLYCERIDES: 74 mg/dL (ref 0.0–149.0)
Total CHOL/HDL Ratio: 4
VLDL: 14.8 mg/dL (ref 0.0–40.0)

## 2014-03-07 LAB — BASIC METABOLIC PANEL
BUN: 11 mg/dL (ref 6–23)
CHLORIDE: 108 meq/L (ref 96–112)
CO2: 23 meq/L (ref 19–32)
Calcium: 8.6 mg/dL (ref 8.4–10.5)
Creatinine, Ser: 0.9 mg/dL (ref 0.4–1.2)
GFR: 78.58 mL/min (ref >60.00)
GLUCOSE: 90 mg/dL (ref 70–99)
POTASSIUM: 4.1 meq/L (ref 3.5–5.1)
SODIUM: 138 meq/L (ref 135–145)

## 2014-03-07 LAB — CBC WITH DIFFERENTIAL/PLATELET
BASOS ABS: 0 10*3/uL (ref 0.0–0.1)
Basophils Relative: 0.6 % (ref 0.0–3.0)
EOS ABS: 0.1 10*3/uL (ref 0.0–0.7)
Eosinophils Relative: 1.6 % (ref 0.0–5.0)
HCT: 40.7 % (ref 36.0–46.0)
Hemoglobin: 13.5 g/dL (ref 12.0–15.0)
LYMPHS PCT: 36.9 % (ref 12.0–46.0)
Lymphs Abs: 2.1 10*3/uL (ref 0.7–4.0)
MCHC: 33.2 g/dL (ref 30.0–36.0)
MCV: 92.7 fl (ref 78.0–100.0)
MONOS PCT: 4.7 % (ref 3.0–12.0)
Monocytes Absolute: 0.3 10*3/uL (ref 0.1–1.0)
NEUTROS ABS: 3.2 10*3/uL (ref 1.4–7.7)
NEUTROS PCT: 56.2 % (ref 43.0–77.0)
PLATELETS: 270 10*3/uL (ref 150.0–400.0)
RBC: 4.39 Mil/uL (ref 3.87–5.11)
RDW: 13.7 % (ref 11.5–15.5)
WBC: 5.7 10*3/uL (ref 4.0–10.5)

## 2014-03-07 LAB — HEPATIC FUNCTION PANEL
ALBUMIN: 4.2 g/dL (ref 3.5–5.2)
ALK PHOS: 47 U/L (ref 39–117)
ALT: 8 U/L (ref 0–35)
AST: 15 U/L (ref 0–37)
BILIRUBIN DIRECT: 0 mg/dL (ref 0.0–0.3)
TOTAL PROTEIN: 7.1 g/dL (ref 6.0–8.3)
Total Bilirubin: 0.6 mg/dL (ref 0.2–1.2)

## 2014-03-07 LAB — TSH: TSH: 1.48 u[IU]/mL (ref 0.35–4.50)

## 2014-03-07 LAB — VITAMIN D 25 HYDROXY (VIT D DEFICIENCY, FRACTURES): VITD: 23.82 ng/mL — AB (ref 30.00–100.00)

## 2014-03-07 NOTE — Patient Instructions (Signed)
Follow up in 1 year or as needed Keep up the good work!  You look great! We'll notify you of your lab results and make any changes if needed Call with any questions or concerns Happy Holidays!! 

## 2014-03-07 NOTE — Progress Notes (Signed)
Pre visit review using our clinic review tool, if applicable. No additional management support is needed unless otherwise documented below in the visit note. 

## 2014-03-07 NOTE — Assessment & Plan Note (Signed)
Pt's PE WNL.  UTD on GYN.  Check labs.  Anticipatory guidance provided.  

## 2014-03-07 NOTE — Progress Notes (Signed)
   Subjective:    Patient ID: Carrie Diaz, female    DOB: 1985-05-17, 28 y.o.   MRN: [id number]  HPI CPE- UTD on GYN.  No concerns today.   Review of Systems Patient reports no vision/ hearing changes, adenopathy,fever, weight change,  persistant/recurrent hoarseness , swallowing issues, chest pain, palpitations, edema, persistant/recurrent cough, hemoptysis, dyspnea (rest/exertional/paroxysmal nocturnal), gastrointestinal bleeding (melena, rectal bleeding), abdominal pain, significant heartburn, bowel changes, GU symptoms (dysuria, hematuria, incontinence), Gyn symptoms (abnormal  bleeding, pain),  syncope, focal weakness, memory loss, numbness & tingling, skin/hair/nail changes, abnormal bruising or bleeding, anxiety, or depression.     Objective:   Physical Exam General Appearance:    Alert, cooperative, no distress, appears stated age  Head:    Normocephalic, without obvious abnormality, atraumatic  Eyes:    PERRL, conjunctiva/corneas clear, EOM's intact, fundi    benign, both eyes  Ears:    Normal TM's and external ear canals, both ears  Nose:   Nares normal, septum midline, mucosa normal, no drainage    or sinus tenderness  Throat:   Lips, mucosa, and tongue normal; teeth and gums normal  Neck:   Supple, symmetrical, trachea midline, no adenopathy;    Thyroid: no enlargement/tenderness/nodules  Back:     Symmetric, no curvature, ROM normal, no CVA tenderness  Lungs:     Clear to auscultation bilaterally, respirations unlabored  Chest Wall:    No tenderness or deformity   Heart:    Regular rate and rhythm, S1 and S2 normal, no murmur, rub   or gallop  Breast Exam:    Deferred to GYN  Abdomen:     Soft, non-tender, bowel sounds active all four quadrants,    no masses, no organomegaly  Genitalia:    Deferred to GYN  Rectal:    Extremities:   Extremities normal, atraumatic, no cyanosis or edema  Pulses:   2+ and symmetric all extremities  Skin:   Skin color, texture, turgor  normal, no rashes or lesions  Lymph nodes:   Cervical, supraclavicular, and axillary nodes normal  Neurologic:   CNII-XII intact, normal strength, sensation and reflexes    throughout          Assessment & Plan:

## 2014-03-08 ENCOUNTER — Telehealth: Payer: Self-pay

## 2014-03-08 ENCOUNTER — Other Ambulatory Visit: Payer: Self-pay | Admitting: General Practice

## 2014-03-08 MED ORDER — VITAMIN D (ERGOCALCIFEROL) 1.25 MG (50000 UNIT) PO CAPS: 50000.0000 [IU] | ORAL_CAPSULE | ORAL | Status: DC

## 2014-03-08 NOTE — Telephone Encounter (Signed)
Pt notified of results

## 2014-03-08 NOTE — Telephone Encounter (Signed)
Carrie Diaz [phone]  Carrie Diaz returned call, she was not sure who called.

## 2014-05-01 ENCOUNTER — Encounter: Payer: Self-pay | Admitting: Family Medicine

## 2014-05-01 ENCOUNTER — Ambulatory Visit (INDEPENDENT_AMBULATORY_CARE_PROVIDER_SITE_OTHER): Payer: 59 | Admitting: Family Medicine

## 2014-05-01 VITALS — BP 110/80 | HR 99 | Temp 98.2°F | Resp 16 | Wt 158.0 lb

## 2014-05-01 DIAGNOSIS — L259 Unspecified contact dermatitis, unspecified cause: Secondary | ICD-10-CM

## 2014-05-01 MED ORDER — TRIAMCINOLONE ACETONIDE 0.1 % EX OINT: 1.0000 "application " | TOPICAL_OINTMENT | Freq: Two times a day (BID) | CUTANEOUS | Status: DC

## 2014-05-01 NOTE — Patient Instructions (Signed)
Follow up as needed Apply a thin layer of steroid ointment twice daily Benadryl at night to prevent reaction Re-evaluate your skin routine and see if anything has changed Wash hands before bed Call with any questions or concerns- if no improvement or worsening, call and we'll send in prednisone Hang in there!!

## 2014-05-01 NOTE — Assessment & Plan Note (Signed)
New.  Pt w/ obvious contact dermatitis around eyes bilaterally but after discussion, we cannot come up w/ obvious cause.  Start topical steroid ointment for under eyes- specifically told pt not to apply to lids- and benadryl prior to bed.  Encouraged pt to wash hands prior to bed and evaluate products to see if we can find the culprit.  If sxs change or worsen, will call in prednisone taper.

## 2014-05-01 NOTE — Progress Notes (Signed)
Pre visit review using our clinic review tool, if applicable. No additional management support is needed unless otherwise documented below in the visit note. 

## 2014-05-01 NOTE — Progress Notes (Signed)
   Subjective:    Patient ID: Jeani Hawkingiffany Bazar, female    DOB: 06/12/1985, 29 y.o.   MRN: [id number]  HPI Eye swelling- pt reports eyes will swell overnight, 'be bad' when she wakes up and then slowly improve as the morning goes on.  This is 2nd morning eyes have been swollen.  Yesterday was just R eye, today was both.  No change in facial products or eyewear.  'it almost feels like a sun burn'.  Not outside this weekend.  No change in meds/foods.   Review of Systems For ROS see HPI     Objective:   Physical Exam  Constitutional: She appears well-developed and well-nourished. No distress.  HENT:  Head: Normocephalic and atraumatic.  Skin: Skin is warm and dry. There is erythema (red, dry, and peeling skin around eyes bilaterally, R>L).  Vitals reviewed.         Assessment & Plan:

## 2014-06-04 ENCOUNTER — Telehealth: Payer: Self-pay | Admitting: Family Medicine

## 2014-06-04 MED ORDER — VALACYCLOVIR HCL 500 MG PO TABS: 500.0000 mg | ORAL_TABLET | Freq: Every day | ORAL | Status: DC

## 2014-06-04 NOTE — Telephone Encounter (Signed)
Med filled.  

## 2014-06-04 NOTE — Telephone Encounter (Signed)
Caller name: Jeani HawkingFee, Rael Relation to pt: self  Call back number: [phone] [facility]: [facility] [id number] - [place], [place] - [address] [phone] ([phone])         Reason for call:  Pt requesting a refill valACYclovir (VALTREX) 500 MG tablet.

## 2014-08-29 ENCOUNTER — Other Ambulatory Visit: Payer: Self-pay | Admitting: Family Medicine

## 2014-08-29 NOTE — Telephone Encounter (Signed)
Med filled.  

## 2014-09-05 LAB — HM PAP SMEAR: HM Pap smear: NORMAL

## 2014-11-15 ENCOUNTER — Other Ambulatory Visit: Payer: Self-pay | Admitting: Obstetrics and Gynecology

## 2014-11-15 DIAGNOSIS — N6311 Unspecified lump in the right breast, upper outer quadrant: Secondary | ICD-10-CM

## 2014-11-19 ENCOUNTER — Ambulatory Visit
Admission: RE | Admit: 2014-11-19 | Discharge: 2014-11-19 | Disposition: A | Discharge status: Home / Self Care | Payer: 59 | Source: Ambulatory Visit | Attending: Obstetrics and Gynecology | Admitting: Obstetrics and Gynecology

## 2014-11-19 DIAGNOSIS — N6311 Unspecified lump in the right breast, upper outer quadrant: Secondary | ICD-10-CM

## 2015-01-07 ENCOUNTER — Ambulatory Visit (INDEPENDENT_AMBULATORY_CARE_PROVIDER_SITE_OTHER): Payer: 59 | Admitting: Family Medicine

## 2015-01-07 ENCOUNTER — Encounter: Payer: Self-pay | Admitting: Family Medicine

## 2015-01-07 VITALS — BP 115/90 | HR 84 | Temp 98.7°F | Resp 20 | Ht 67.2 in | Wt 158.4 lb

## 2015-01-07 DIAGNOSIS — F411 Generalized anxiety disorder: Secondary | ICD-10-CM

## 2015-01-07 MED ORDER — BUPROPION HCL ER (XL) 150 MG PO TB24: 450.0000 mg | ORAL_TABLET | Freq: Every day | ORAL | Status: DC

## 2015-01-07 NOTE — Patient Instructions (Signed)
Follow up as scheduled Continue the Wellbutrin daily Call and schedule an appt with a new psychiatrist Keep up the good work!  You look great! Call with any questions or concerns If you want to join [place] at the new [place] office, any scheduled appointments will automatically transfer and we will see you at [address] [place] [address] [place], [place] [address]  Happy Belated Birthday!!!

## 2015-01-07 NOTE — Progress Notes (Signed)
Pre visit review using our clinic review tool, if applicable. No additional management support is needed unless otherwise documented below in the visit note. 

## 2015-01-07 NOTE — Assessment & Plan Note (Signed)
Ongoing issue for pt.  Pt was previously well controlled but recently worsening.  Pt asking for refill on Wellbutrin while establishing care w/ new psych.  Names and numbers of new providers given for pt to call and establish.  Pt expressed understanding and is in agreement w/ plan.

## 2015-01-07 NOTE — Progress Notes (Signed)
   Subjective:    Patient ID: Carrie Diaz, female    DOB: Aug 18, 1985, 29 y.o.   MRN: [id number]  HPI Anxiety- chronic problem, was seeing Dr Tomasa Rand who was prescribing Wellbutrin.  This doctor left and she is running out of meds.  'i feel fine'.  Admits to increased anxiety over the last few months.  Plans to establish w/ new psych- will give list of names and #s.  No SI/HI.  Sleeping well w/ exception of changing shifts.   Review of Systems For ROS see HPI     Objective:   Physical Exam  Constitutional: She is oriented to person, place, and time. She appears well-developed and well-nourished. No distress.  HENT:  Head: Normocephalic and atraumatic.  Eyes: Conjunctivae and EOM are normal. Pupils are equal, round, and reactive to light.  Neurological: She is alert and oriented to person, place, and time.  Skin: Skin is warm and dry.  Psychiatric: She has a normal mood and affect. Her behavior is normal. Thought content normal.  Vitals reviewed.         Assessment & Plan:

## 2015-04-04 ENCOUNTER — Telehealth: Payer: Self-pay | Admitting: Behavioral Health

## 2015-04-04 ENCOUNTER — Encounter: Payer: Self-pay | Admitting: Behavioral Health

## 2015-04-04 NOTE — Telephone Encounter (Signed)
Pre-Visit Call completed with patient and chart updated.   Pre-Visit Info documented in Specialty Comments under [employer].    

## 2015-04-05 ENCOUNTER — Encounter: Payer: 59 | Admitting: Family Medicine

## 2015-04-05 ENCOUNTER — Telehealth: Payer: Self-pay | Admitting: Family Medicine

## 2015-04-05 NOTE — Telephone Encounter (Signed)
Patient no show appointment 04/05/15 at 8am, charge or no charge

## 2015-04-08 NOTE — Telephone Encounter (Signed)
Yes- pt needs a charge 

## 2015-04-26 ENCOUNTER — Ambulatory Visit: Payer: 59 | Admitting: Family Medicine

## 2016-07-02 ENCOUNTER — Ambulatory Visit (INDEPENDENT_AMBULATORY_CARE_PROVIDER_SITE_OTHER): Payer: 59 | Admitting: Family Medicine

## 2016-07-02 ENCOUNTER — Other Ambulatory Visit: Payer: Self-pay | Admitting: General Practice

## 2016-07-02 ENCOUNTER — Encounter: Payer: Self-pay | Admitting: Family Medicine

## 2016-07-02 VITALS — BP 112/88 | HR 86 | Temp 98.9°F | Resp 17 | Ht 67.0 in | Wt 171.2 lb

## 2016-07-02 DIAGNOSIS — Z0000 Encounter for general adult medical examination without abnormal findings: Secondary | ICD-10-CM

## 2016-07-02 LAB — LIPID PANEL
CHOL/HDL RATIO: 3
Cholesterol: 183 mg/dL (ref 0–200)
HDL: 63.1 mg/dL (ref >39.00)
LDL CALC: 108 mg/dL — AB (ref 0–99)
NonHDL: 120.06
Triglycerides: 61 mg/dL (ref 0.0–149.0)
VLDL: 12.2 mg/dL (ref 0.0–40.0)

## 2016-07-02 LAB — CBC WITH DIFFERENTIAL/PLATELET
BASOS ABS: 0.1 10*3/uL (ref 0.0–0.1)
Basophils Relative: 0.9 % (ref 0.0–3.0)
Eosinophils Absolute: 0.1 10*3/uL (ref 0.0–0.7)
Eosinophils Relative: 0.7 % (ref 0.0–5.0)
HEMATOCRIT: 41.7 % (ref 36.0–46.0)
HEMOGLOBIN: 13.9 g/dL (ref 12.0–15.0)
LYMPHS PCT: 22.9 % (ref 12.0–46.0)
Lymphs Abs: 1.8 10*3/uL (ref 0.7–4.0)
MCHC: 33.4 g/dL (ref 30.0–36.0)
MCV: 92.5 fl (ref 78.0–100.0)
MONOS PCT: 5.1 % (ref 3.0–12.0)
Monocytes Absolute: 0.4 10*3/uL (ref 0.1–1.0)
Neutro Abs: 5.4 10*3/uL (ref 1.4–7.7)
Neutrophils Relative %: 70.4 % (ref 43.0–77.0)
Platelets: 294 10*3/uL (ref 150.0–400.0)
RBC: 4.51 Mil/uL (ref 3.87–5.11)
RDW: 13.4 % (ref 11.5–15.5)
WBC: 7.7 10*3/uL (ref 4.0–10.5)

## 2016-07-02 LAB — HEPATIC FUNCTION PANEL
ALBUMIN: 4.3 g/dL (ref 3.5–5.2)
ALT: 11 U/L (ref 0–35)
AST: 13 U/L (ref 0–37)
Alkaline Phosphatase: 46 U/L (ref 39–117)
Bilirubin, Direct: 0.1 mg/dL (ref 0.0–0.3)
Total Bilirubin: 0.4 mg/dL (ref 0.2–1.2)
Total Protein: 7.2 g/dL (ref 6.0–8.3)

## 2016-07-02 LAB — BASIC METABOLIC PANEL
BUN: 9 mg/dL (ref 6–23)
CO2: 28 mEq/L (ref 19–32)
Calcium: 9.5 mg/dL (ref 8.4–10.5)
Chloride: 104 mEq/L (ref 96–112)
Creatinine, Ser: 0.83 mg/dL (ref 0.40–1.20)
GFR: 84.94 mL/min (ref >60.00)
Glucose, Bld: 94 mg/dL (ref 70–99)
POTASSIUM: 4.4 meq/L (ref 3.5–5.1)
SODIUM: 139 meq/L (ref 135–145)

## 2016-07-02 LAB — TSH: TSH: 1.47 u[IU]/mL (ref 0.35–4.50)

## 2016-07-02 LAB — VITAMIN D 25 HYDROXY (VIT D DEFICIENCY, FRACTURES): VITD: 22.78 ng/mL — AB (ref 30.00–100.00)

## 2016-07-02 MED ORDER — VITAMIN D (ERGOCALCIFEROL) 1.25 MG (50000 UNIT) PO CAPS: 50000.0000 [IU] | ORAL_CAPSULE | ORAL | 0 refills | Status: DC

## 2016-07-02 NOTE — Progress Notes (Signed)
   Subjective:    Patient ID: Carrie Diaz, female    DOB: 11/10/1985, 31 y.o.   MRN: [id number]  HPI CPE- UTD on pap w/ Dr Normand Sloopillard.  No concerns today.   Review of Systems Patient reports no vision/ hearing changes, adenopathy,fever, weight change,  persistant/recurrent hoarseness , swallowing issues, chest pain, palpitations, edema, persistant/recurrent cough, hemoptysis, dyspnea (rest/exertional/paroxysmal nocturnal), gastrointestinal bleeding (melena, rectal bleeding), abdominal pain, significant heartburn, bowel changes, GU symptoms (dysuria, hematuria, incontinence), Gyn symptoms (abnormal  bleeding, pain),  syncope, focal weakness, memory loss, numbness & tingling, skin/hair/nail changes, abnormal bruising or bleeding, anxiety, or depression.     Objective:   Physical Exam General Appearance:    Alert, cooperative, no distress, appears stated age  Head:    Normocephalic, without obvious abnormality, atraumatic  Eyes:    PERRL, conjunctiva/corneas clear, EOM's intact, fundi    benign, both eyes  Ears:    Normal TM's and external ear canals, both ears  Nose:   Nares normal, septum midline, mucosa normal, no drainage    or sinus tenderness  Throat:   Lips, mucosa, and tongue normal; teeth and gums normal  Neck:   Supple, symmetrical, trachea midline, no adenopathy;    Thyroid: no enlargement/tenderness/nodules  Back:     Symmetric, no curvature, ROM normal, no CVA tenderness  Lungs:     Clear to auscultation bilaterally, respirations unlabored  Chest Wall:    No tenderness or deformity   Heart:    Regular rate and rhythm, S1 and S2 normal, no murmur, rub   or gallop  Breast Exam:    Deferred to GYN  Abdomen:     Soft, non-tender, bowel sounds active all four quadrants,    no masses, no organomegaly  Genitalia:    Deferred to GYN  Rectal:    Extremities:   Extremities normal, atraumatic, no cyanosis or edema  Pulses:   2+ and symmetric all extremities  Skin:   Skin color, texture,  turgor normal, no rashes or lesions  Lymph nodes:   Cervical, supraclavicular, and axillary nodes normal  Neurologic:   CNII-XII intact, normal strength, sensation and reflexes    throughout         Assessment & Plan:

## 2016-07-02 NOTE — Patient Instructions (Signed)
Follow up in 1 year or as needed We'll notify you of your lab results and make any changes if needed Continue to work on healthy diet and regular exercise- you look great!!! Call with any questions or concerns Happy [place]dis Lusteraster!!!

## 2016-07-02 NOTE — Progress Notes (Signed)
Pre visit review using our clinic review tool, if applicable. No additional management support is needed unless otherwise documented below in the visit note. 

## 2016-07-02 NOTE — Assessment & Plan Note (Signed)
Pt's PE WNL w/ exception of some weight gain.  UTD on GYN.  Check labs.  Anticipatory guidance provided.

## 2016-07-09 ENCOUNTER — Encounter: Payer: 59 | Admitting: Family Medicine

## 2016-07-30 ENCOUNTER — Other Ambulatory Visit: Payer: Self-pay | Admitting: Family Medicine

## 2016-07-30 NOTE — Telephone Encounter (Signed)
Medication filled to pharmacy as requested.   

## 2016-08-13 ENCOUNTER — Telehealth: Payer: Self-pay | Admitting: Family Medicine

## 2016-08-13 NOTE — Telephone Encounter (Signed)
Form completed and placed in basket for pick up/fax

## 2016-08-13 NOTE — Telephone Encounter (Signed)
Fax received 08/13/16 from patient, requesting to have health and wellness form signed and dated.  The original form was rejected as it was missing this info.  Charge sheet attached to form and placed in pcp's folder for signature.

## 2016-08-18 NOTE — Telephone Encounter (Signed)
Forms were faxed back on 08/14/16, Pt called stating that they have not received them. Forms were re faxed on 08/18/16.

## 2016-12-16 ENCOUNTER — Other Ambulatory Visit: Payer: Self-pay | Admitting: Family Medicine

## 2016-12-21 DIAGNOSIS — Z01419 Encounter for gynecological examination (general) (routine) without abnormal findings: Secondary | ICD-10-CM | POA: Diagnosis not present

## 2017-01-11 DIAGNOSIS — N840 Polyp of corpus uteri: Secondary | ICD-10-CM | POA: Diagnosis not present

## 2017-01-26 ENCOUNTER — Other Ambulatory Visit: Payer: Self-pay | Admitting: Occupational Medicine

## 2017-01-26 ENCOUNTER — Ambulatory Visit: Payer: Self-pay

## 2017-01-26 DIAGNOSIS — M25562 Pain in left knee: Secondary | ICD-10-CM

## 2017-02-05 ENCOUNTER — Other Ambulatory Visit: Payer: Self-pay | Admitting: Obstetrics and Gynecology

## 2017-02-05 DIAGNOSIS — N840 Polyp of corpus uteri: Secondary | ICD-10-CM | POA: Diagnosis not present

## 2017-02-14 ENCOUNTER — Other Ambulatory Visit: Payer: Self-pay | Admitting: Family Medicine

## 2017-02-19 DIAGNOSIS — Z09 Encounter for follow-up examination after completed treatment for conditions other than malignant neoplasm: Secondary | ICD-10-CM | POA: Diagnosis not present

## 2017-04-18 ENCOUNTER — Other Ambulatory Visit: Payer: Self-pay | Admitting: Family Medicine

## 2017-05-26 LAB — HM PAP SMEAR

## 2017-08-04 ENCOUNTER — Other Ambulatory Visit: Payer: Self-pay | Admitting: Family Medicine

## 2017-08-26 ENCOUNTER — Other Ambulatory Visit: Payer: Self-pay | Admitting: Family Medicine

## 2017-08-26 ENCOUNTER — Other Ambulatory Visit: Payer: Self-pay | Admitting: General Practice

## 2017-08-26 MED ORDER — BUPROPION HCL ER (XL) 150 MG PO TB24
ORAL_TABLET | ORAL | 0 refills | Status: DC
Start: 2017-08-26 — End: 2017-10-05

## 2017-09-23 ENCOUNTER — Encounter

## 2017-09-23 ENCOUNTER — Encounter: Payer: Self-pay | Admitting: Family Medicine

## 2017-09-23 ENCOUNTER — Ambulatory Visit (INDEPENDENT_AMBULATORY_CARE_PROVIDER_SITE_OTHER): Payer: 59 | Admitting: Family Medicine

## 2017-09-23 ENCOUNTER — Other Ambulatory Visit: Payer: Self-pay

## 2017-09-23 VITALS — BP 110/80 | HR 80 | Temp 98.7°F | Resp 16 | Ht 67.0 in | Wt 176.0 lb

## 2017-09-23 DIAGNOSIS — Z0000 Encounter for general adult medical examination without abnormal findings: Secondary | ICD-10-CM | POA: Diagnosis not present

## 2017-09-23 DIAGNOSIS — E663 Overweight: Secondary | ICD-10-CM

## 2017-09-23 DIAGNOSIS — E559 Vitamin D deficiency, unspecified: Secondary | ICD-10-CM | POA: Diagnosis not present

## 2017-09-23 MED ORDER — OMEPRAZOLE 20 MG PO CPDR: 20.0000 mg | DELAYED_RELEASE_CAPSULE | Freq: Every day | ORAL | 3 refills | Status: DC

## 2017-09-23 NOTE — Assessment & Plan Note (Signed)
Pt has hx of this.  Check labs.  Replete prn. 

## 2017-09-23 NOTE — Patient Instructions (Signed)
Follow up in 1 year or as needed We'll notify you of your lab results and make any changes if needed Keep up the good work on healthy diet and regular exercise- you can do it! Call with any questions or concerns Have a great summer!!! 

## 2017-09-23 NOTE — Progress Notes (Signed)
   Subjective:    Patient ID: Carrie Diaz, female    DOB: 22-Jan-1986, 32 y.o.   MRN: [id number]  HPI CPE- UTD on pap.  UTD on Tdap.  No concerns today.   Review of Systems Patient reports no vision/ hearing changes, adenopathy,fever, weight change,  persistant/recurrent hoarseness , swallowing issues, chest pain, palpitations, edema, persistant/recurrent cough, hemoptysis, dyspnea (rest/exertional/paroxysmal nocturnal), gastrointestinal bleeding (melena, rectal bleeding), abdominal pain, significant heartburn, bowel changes, GU symptoms (dysuria, hematuria, incontinence), Gyn symptoms (abnormal  bleeding, pain),  syncope, focal weakness, memory loss, numbness & tingling, skin/hair/nail changes, abnormal bruising or bleeding, anxiety, or depression.     Objective:   Physical Exam General Appearance:    Alert, cooperative, no distress, appears stated age  Head:    Normocephalic, without obvious abnormality, atraumatic  Eyes:    PERRL, conjunctiva/corneas clear, EOM's intact, fundi    benign, both eyes  Ears:    Normal TM's and external ear canals, both ears  Nose:   Nares normal, septum midline, mucosa normal, no drainage    or sinus tenderness  Throat:   Lips, mucosa, and tongue normal; teeth and gums normal  Neck:   Supple, symmetrical, trachea midline, no adenopathy;    Thyroid: no enlargement/tenderness/nodules  Back:     Symmetric, no curvature, ROM normal, no CVA tenderness  Lungs:     Clear to auscultation bilaterally, respirations unlabored  Chest Wall:    No tenderness or deformity   Heart:    Regular rate and rhythm, S1 and S2 normal, no murmur, rub   or gallop  Breast Exam:    Deferred to GYN  Abdomen:     Soft, non-tender, bowel sounds active all four quadrants,    no masses, no organomegaly  Genitalia:    Deferred to GYN  Rectal:    Extremities:   Extremities normal, atraumatic, no cyanosis or edema  Pulses:   2+ and symmetric all extremities  Skin:   Skin color, texture,  turgor normal, no rashes or lesions  Lymph nodes:   Cervical, supraclavicular, and axillary nodes normal  Neurologic:   CNII-XII intact, normal strength, sensation and reflexes    throughout          Assessment & Plan:

## 2017-09-23 NOTE — Assessment & Plan Note (Signed)
Pt's PE WNL w/ exception of being overweight.  UTD on GYN.  Check labs.  Anticipatory guidance provided.  

## 2017-09-24 LAB — CBC WITH DIFFERENTIAL/PLATELET
BASOS ABS: 0.1 10*3/uL (ref 0.0–0.1)
Basophils Relative: 1.3 % (ref 0.0–3.0)
EOS ABS: 0.1 10*3/uL (ref 0.0–0.7)
Eosinophils Relative: 1.5 % (ref 0.0–5.0)
HEMATOCRIT: 42.4 % (ref 36.0–46.0)
HEMOGLOBIN: 14.3 g/dL (ref 12.0–15.0)
LYMPHS PCT: 27.2 % (ref 12.0–46.0)
Lymphs Abs: 2.2 10*3/uL (ref 0.7–4.0)
MCHC: 33.9 g/dL (ref 30.0–36.0)
MCV: 91.1 fl (ref 78.0–100.0)
MONO ABS: 0.5 10*3/uL (ref 0.1–1.0)
Monocytes Relative: 5.7 % (ref 3.0–12.0)
Neutro Abs: 5.2 10*3/uL (ref 1.4–7.7)
Neutrophils Relative %: 64.3 % (ref 43.0–77.0)
Platelets: 375 10*3/uL (ref 150.0–400.0)
RBC: 4.65 Mil/uL (ref 3.87–5.11)
RDW: 12.8 % (ref 11.5–15.5)
WBC: 8.1 10*3/uL (ref 4.0–10.5)

## 2017-09-24 LAB — LIPID PANEL
Cholesterol: 221 mg/dL — ABNORMAL HIGH (ref 0–200)
HDL: 69.7 mg/dL (ref >39.00)
LDL Cholesterol: 135 mg/dL — ABNORMAL HIGH (ref 0–99)
NONHDL: 151.48
Total CHOL/HDL Ratio: 3
Triglycerides: 83 mg/dL (ref 0.0–149.0)
VLDL: 16.6 mg/dL (ref 0.0–40.0)

## 2017-09-24 LAB — BASIC METABOLIC PANEL
BUN: 10 mg/dL (ref 6–23)
CALCIUM: 9.5 mg/dL (ref 8.4–10.5)
CHLORIDE: 99 meq/L (ref 96–112)
CO2: 28 meq/L (ref 19–32)
Creatinine, Ser: 0.87 mg/dL (ref 0.40–1.20)
GFR: 79.83 mL/min (ref >60.00)
GLUCOSE: 102 mg/dL — AB (ref 70–99)
Potassium: 4.3 mEq/L (ref 3.5–5.1)
Sodium: 136 mEq/L (ref 135–145)

## 2017-09-24 LAB — HEPATIC FUNCTION PANEL
ALT: 14 U/L (ref 0–35)
AST: 15 U/L (ref 0–37)
Albumin: 4.5 g/dL (ref 3.5–5.2)
Alkaline Phosphatase: 59 U/L (ref 39–117)
Bilirubin, Direct: 0.1 mg/dL (ref 0.0–0.3)
TOTAL PROTEIN: 7.5 g/dL (ref 6.0–8.3)
Total Bilirubin: 0.5 mg/dL (ref 0.2–1.2)

## 2017-09-24 LAB — TSH: TSH: 1.09 u[IU]/mL (ref 0.35–4.50)

## 2017-09-24 LAB — VITAMIN D 25 HYDROXY (VIT D DEFICIENCY, FRACTURES): VITD: 54.42 ng/mL (ref 30.00–100.00)

## 2017-09-28 ENCOUNTER — Encounter: Payer: Self-pay | Admitting: General Practice

## 2017-10-05 ENCOUNTER — Other Ambulatory Visit: Payer: Self-pay | Admitting: Family Medicine

## 2017-11-10 ENCOUNTER — Other Ambulatory Visit: Payer: Self-pay | Admitting: Family Medicine

## 2018-01-03 ENCOUNTER — Other Ambulatory Visit: Payer: Self-pay | Admitting: Family Medicine

## 2018-02-08 ENCOUNTER — Other Ambulatory Visit: Payer: Self-pay

## 2018-02-08 ENCOUNTER — Encounter: Payer: Self-pay | Admitting: Family Medicine

## 2018-02-08 ENCOUNTER — Ambulatory Visit: Payer: 59 | Admitting: Family Medicine

## 2018-02-08 VITALS — BP 121/83 | HR 76 | Temp 98.1°F | Resp 16 | Ht 67.0 in | Wt 188.2 lb

## 2018-02-08 DIAGNOSIS — Z23 Encounter for immunization: Secondary | ICD-10-CM | POA: Diagnosis not present

## 2018-02-08 DIAGNOSIS — G4700 Insomnia, unspecified: Secondary | ICD-10-CM | POA: Diagnosis not present

## 2018-02-08 DIAGNOSIS — F411 Generalized anxiety disorder: Secondary | ICD-10-CM

## 2018-02-08 MED ORDER — ZOLPIDEM TARTRATE 10 MG PO TABS: 10.0000 mg | ORAL_TABLET | Freq: Every day | ORAL | 3 refills | Status: DC

## 2018-02-08 MED ORDER — BUPROPION HCL ER (XL) 150 MG PO TB24: ORAL_TABLET | ORAL | 1 refills | Status: DC

## 2018-02-08 NOTE — Patient Instructions (Signed)
Follow up as scheduled for your physical Try and decrease the Wellbutrin to 300mg  daily- you can certainly go back up if needed Continue the Ambien as needed for sleep Try and get back to regular exercise- it helps w/ sleep and mood Call with any questions or concerns Happy Holidays!!!

## 2018-02-08 NOTE — Assessment & Plan Note (Signed)
Well controlled.  Pt is interested in trying to wean down her Wellbutrin and see if sxs remain controlled.  Will send full amount of meds in case pt needs to go back up to 3 tabs daily.  Pt expressed understanding and is in agreement w/ plan.

## 2018-02-08 NOTE — Assessment & Plan Note (Signed)
Pt has difficulty switching sleep schedules based on work shifts.  Continue to use Ambien prn.

## 2018-02-08 NOTE — Progress Notes (Signed)
   Subjective:    Patient ID: Carrie Diaz, female    DOB: [birth year], 32 y.o.   MRN: [id number]  HPI Anxiety- ongoing issue for pt.  On Wellbutrin 450mg  daily.  'I've been on it for so long'.  Pt is interested in stepping down to 300mg  and seeing how she feels.  Pt still has occasional anxiety.  Pt reports difficulty sleeping due to 'weird hours' at work.  Ambien will help her fall asleep- was previously filled by psych.  Pt reports work is going well and things at home are now good after some recent stressors.   Review of Systems For ROS see HPI     Objective:   Physical Exam  Constitutional: She is oriented to person, place, and time. She appears well-developed and well-nourished. No distress.  overweight  HENT:  Head: Normocephalic and atraumatic.  Cardiovascular: Normal rate, regular rhythm and normal heart sounds.  Pulmonary/Chest: Effort normal and breath sounds normal. No respiratory distress. She has no wheezes.  Neurological: She is alert and oriented to person, place, and time.  Skin: Skin is warm and dry.  Psychiatric: She has a normal mood and affect. Her behavior is normal. Thought content normal.  Vitals reviewed.         Assessment & Plan:

## 2018-05-02 ENCOUNTER — Encounter: Payer: Self-pay | Admitting: Family Medicine

## 2018-05-02 ENCOUNTER — Other Ambulatory Visit: Payer: Self-pay

## 2018-05-02 ENCOUNTER — Ambulatory Visit: Payer: 59 | Admitting: Family Medicine

## 2018-05-02 VITALS — BP 120/81 | HR 83 | Temp 98.1°F | Resp 16 | Ht 67.0 in | Wt 181.4 lb

## 2018-05-02 DIAGNOSIS — R197 Diarrhea, unspecified: Secondary | ICD-10-CM | POA: Diagnosis not present

## 2018-05-02 MED ORDER — ONDANSETRON HCL 4 MG PO TABS: 4.0000 mg | ORAL_TABLET | Freq: Three times a day (TID) | ORAL | 0 refills | Status: DC | PRN

## 2018-05-02 NOTE — Progress Notes (Signed)
   Subjective:    Patient ID: Carrie Diaz, female    DOB: Nov 05, 1985, 33 y.o.   MRN: [id number]  HPI Diarrhea- sxs developed Saturday night.  Stools are 'pretty much water'.  No blood.  No vomiting.  Developed leg cramps this AM.  Reports drinking water.  No fevers.  Yesterday had 'at least 6' stools.  Took Immodium yesterday w/o relief.  Today has had 2 stools- somewhat more formed.  + abdominal cramping- but started period today.  No known sick contacts.  No recent abx use.     Review of Systems For ROS see HPI     Objective:   Physical Exam Vitals signs reviewed.  Constitutional:      General: She is not in acute distress.    Appearance: She is well-developed.  HENT:     Head: Normocephalic and atraumatic.     Mouth/Throat:     Mouth: Mucous membranes are moist.  Neck:     Musculoskeletal: Neck supple.  Cardiovascular:     Rate and Rhythm: Normal rate and regular rhythm.  Pulmonary:     Effort: Pulmonary effort is normal. No respiratory distress.     Breath sounds: Normal breath sounds. No wheezing or rales.  Abdominal:     General: There is no distension.     Palpations: Abdomen is soft.     Tenderness: There is no abdominal tenderness. There is no rebound.     Comments: Hyperactive BS  Lymphadenopathy:     Cervical: No cervical adenopathy.  Skin:    General: Skin is warm and dry.  Neurological:     Mental Status: She is alert and oriented to person, place, and time.           Assessment & Plan:  Diarrhea- new.  sxs started Saturday evening.  Had 6 watery stools yesterday, only 2 thus far today and they were more formed.  Suspect this is viral and will run its course.  No red flags on hx or PE.  Zofran as needed for nausea.  Continue immodium.  Increase fluid intake.  Advance diet slowly.  Pt expressed understanding and is in agreement w/ plan.

## 2018-05-02 NOTE — Patient Instructions (Signed)
Follow up as needed or as scheduled Continue to drink plenty of fluids Try and eat pretzels, apple sauce, bananas, crackers, etc to help w/ upset stomach Use the Zofran as needed for nausea- will also slow her bowels Continue Immodium as needed Call with any questions or concerns Hang in there!!!

## 2018-09-28 ENCOUNTER — Other Ambulatory Visit: Payer: Self-pay

## 2018-09-28 ENCOUNTER — Encounter: Payer: Self-pay | Admitting: Family Medicine

## 2018-09-28 ENCOUNTER — Ambulatory Visit (INDEPENDENT_AMBULATORY_CARE_PROVIDER_SITE_OTHER): Payer: 59 | Admitting: Family Medicine

## 2018-09-28 VITALS — BP 118/78 | HR 78 | Temp 98.0°F | Resp 16 | Ht 67.0 in | Wt 205.4 lb

## 2018-09-28 DIAGNOSIS — Z0000 Encounter for general adult medical examination without abnormal findings: Secondary | ICD-10-CM | POA: Diagnosis not present

## 2018-09-28 DIAGNOSIS — E669 Obesity, unspecified: Secondary | ICD-10-CM | POA: Diagnosis not present

## 2018-09-28 DIAGNOSIS — E559 Vitamin D deficiency, unspecified: Secondary | ICD-10-CM

## 2018-09-28 DIAGNOSIS — E663 Overweight: Secondary | ICD-10-CM | POA: Insufficient documentation

## 2018-09-28 LAB — HEPATIC FUNCTION PANEL
ALT: 9 U/L (ref 0–35)
AST: 11 U/L (ref 0–37)
Albumin: 4.2 g/dL (ref 3.5–5.2)
Alkaline Phosphatase: 61 U/L (ref 39–117)
Bilirubin, Direct: 0.1 mg/dL (ref 0.0–0.3)
Total Bilirubin: 0.4 mg/dL (ref 0.2–1.2)
Total Protein: 7 g/dL (ref 6.0–8.3)

## 2018-09-28 LAB — BASIC METABOLIC PANEL
BUN: 14 mg/dL (ref 6–23)
CO2: 25 mEq/L (ref 19–32)
Calcium: 8.7 mg/dL (ref 8.4–10.5)
Chloride: 104 mEq/L (ref 96–112)
Creatinine, Ser: 1.41 mg/dL — ABNORMAL HIGH (ref 0.40–1.20)
GFR: 42.76 mL/min — ABNORMAL LOW (ref >60.00)
Glucose, Bld: 71 mg/dL (ref 70–99)
Potassium: 4.6 mEq/L (ref 3.5–5.1)
Sodium: 137 mEq/L (ref 135–145)

## 2018-09-28 LAB — CBC WITH DIFFERENTIAL/PLATELET
Basophils Absolute: 0 10*3/uL (ref 0.0–0.1)
Basophils Relative: 0.5 % (ref 0.0–3.0)
Eosinophils Absolute: 0.1 10*3/uL (ref 0.0–0.7)
Eosinophils Relative: 1.2 % (ref 0.0–5.0)
HCT: 40.3 % (ref 36.0–46.0)
Hemoglobin: 13.1 g/dL (ref 12.0–15.0)
Lymphocytes Relative: 33 % (ref 12.0–46.0)
Lymphs Abs: 2.4 10*3/uL (ref 0.7–4.0)
MCHC: 32.6 g/dL (ref 30.0–36.0)
MCV: 87.1 fl (ref 78.0–100.0)
Monocytes Absolute: 0.5 10*3/uL (ref 0.1–1.0)
Monocytes Relative: 6.7 % (ref 3.0–12.0)
Neutro Abs: 4.2 10*3/uL (ref 1.4–7.7)
Neutrophils Relative %: 58.6 % (ref 43.0–77.0)
Platelets: 333 10*3/uL (ref 150.0–400.0)
RBC: 4.62 Mil/uL (ref 3.87–5.11)
RDW: 14.1 % (ref 11.5–15.5)
WBC: 7.2 10*3/uL (ref 4.0–10.5)

## 2018-09-28 LAB — VITAMIN D 25 HYDROXY (VIT D DEFICIENCY, FRACTURES): VITD: 44.6 ng/mL (ref 30.00–100.00)

## 2018-09-28 LAB — LIPID PANEL
Cholesterol: 207 mg/dL — ABNORMAL HIGH (ref 0–200)
HDL: 51.4 mg/dL (ref >39.00)
LDL Cholesterol: 138 mg/dL — ABNORMAL HIGH (ref 0–99)
NonHDL: 155.63
Total CHOL/HDL Ratio: 4
Triglycerides: 89 mg/dL (ref 0.0–149.0)
VLDL: 17.8 mg/dL (ref 0.0–40.0)

## 2018-09-28 LAB — TSH: TSH: 1.35 u[IU]/mL (ref 0.35–4.50)

## 2018-09-28 NOTE — Patient Instructions (Addendum)
Follow up in 1 year or as needed We'll notify you of your lab results and make any changes if needed Continue to work on healthy diet and regular exercise- you can do it!! Call with any questions or concerns Stay Safe!!! 

## 2018-09-28 NOTE — Assessment & Plan Note (Signed)
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  Encouraged healthy diet and regular exercise.  Check labs to risk stratify.  Will follow.

## 2018-09-28 NOTE — Progress Notes (Signed)
   Subjective:    Patient ID: Carrie Diaz, female    DOB: April 08, 1985, 33 y.o.   MRN: [id number]  HPI CPE- UTD on immunizations, GYN.     Review of Systems Patient reports no vision/ hearing changes, adenopathy,fever, weight change,  persistant/recurrent hoarseness , swallowing issues, chest pain, palpitations, edema, persistant/recurrent cough, hemoptysis, dyspnea (rest/exertional/paroxysmal nocturnal), gastrointestinal bleeding (melena, rectal bleeding), abdominal pain, significant heartburn, bowel changes, GU symptoms (dysuria, hematuria, incontinence), Gyn symptoms (abnormal  bleeding, pain),  syncope, focal weakness, memory loss, numbness & tingling, skin/hair/nail changes, abnormal bruising or bleeding, anxiety, or depression.      Objective:   Physical Exam General Appearance:    Alert, cooperative, no distress, appears stated age  Head:    Normocephalic, without obvious abnormality, atraumatic  Eyes:    PERRL, conjunctiva/corneas clear, EOM's intact, fundi    benign, both eyes  Ears:    Normal TM's and external ear canals, both ears  Nose:   Nares normal, septum midline, mucosa normal, no drainage    or sinus tenderness  Throat:   Lips, mucosa, and tongue normal; teeth and gums normal  Neck:   Supple, symmetrical, trachea midline, no adenopathy;    Thyroid: no enlargement/tenderness/nodules  Back:     Symmetric, no curvature, ROM normal, no CVA tenderness  Lungs:     Clear to auscultation bilaterally, respirations unlabored  Chest Wall:    No tenderness or deformity   Heart:    Regular rate and rhythm, S1 and S2 normal, no murmur, rub   or gallop  Breast Exam:    Deferred to GYN  Abdomen:     Soft, non-tender, bowel sounds active all four quadrants,    no masses, no organomegaly  Genitalia:    Deferred to GYN  Rectal:    Extremities:   Extremities normal, atraumatic, no cyanosis or edema  Pulses:   2+ and symmetric all extremities  Skin:   Skin color, texture, turgor normal,  no rashes or lesions  Lymph nodes:   Cervical, supraclavicular, and axillary nodes normal  Neurologic:   CNII-XII intact, normal strength, sensation and reflexes    throughout          Assessment & Plan:

## 2018-09-28 NOTE — Assessment & Plan Note (Signed)
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  UTD on GYN.  Check labs.  Anticipatory guidance provided.

## 2018-09-29 ENCOUNTER — Other Ambulatory Visit: Payer: Self-pay | Admitting: Family Medicine

## 2018-09-29 DIAGNOSIS — R7989 Other specified abnormal findings of blood chemistry: Secondary | ICD-10-CM

## 2018-10-11 ENCOUNTER — Other Ambulatory Visit: Payer: Self-pay | Admitting: Family Medicine

## 2018-11-22 ENCOUNTER — Encounter: Payer: Self-pay | Admitting: Physician Assistant

## 2018-11-22 ENCOUNTER — Ambulatory Visit: Payer: 59 | Admitting: Physician Assistant

## 2018-11-22 ENCOUNTER — Other Ambulatory Visit: Payer: Self-pay

## 2018-11-22 VITALS — BP 104/80 | HR 72 | Temp 98.6°F | Resp 16

## 2018-11-22 DIAGNOSIS — M25562 Pain in left knee: Secondary | ICD-10-CM

## 2018-11-22 MED ORDER — MELOXICAM 15 MG PO TABS: 15.0000 mg | ORAL_TABLET | Freq: Every day | ORAL | 0 refills | Status: DC

## 2018-11-22 NOTE — Patient Instructions (Signed)
Please avoid overexertion and heavy lifting. Elevate the knee/leg while resting. Apply heating pad for 10 minutes a couple of times per day. Wear the compression sleeve daily for the next 1-2 weeks. Meloxicam once in the morning with food for pain and inflammation. Tylenol if needed for breakthrough pain.  If symptoms are not easing up we would need to obtain imaging.
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
## 2019-01-16 ENCOUNTER — Other Ambulatory Visit: Payer: Self-pay | Admitting: Family Medicine

## 2019-02-20 IMAGING — DX DG KNEE COMPLETE 4+V*L*
4 series · 4 of 4 positions shown · non-contrast
Comparison: None

CLINICAL DATA: Twisted left knee.  Pain.

EXAM:
LEFT KNEE - COMPLETE 4+ VIEW

[knee ap]
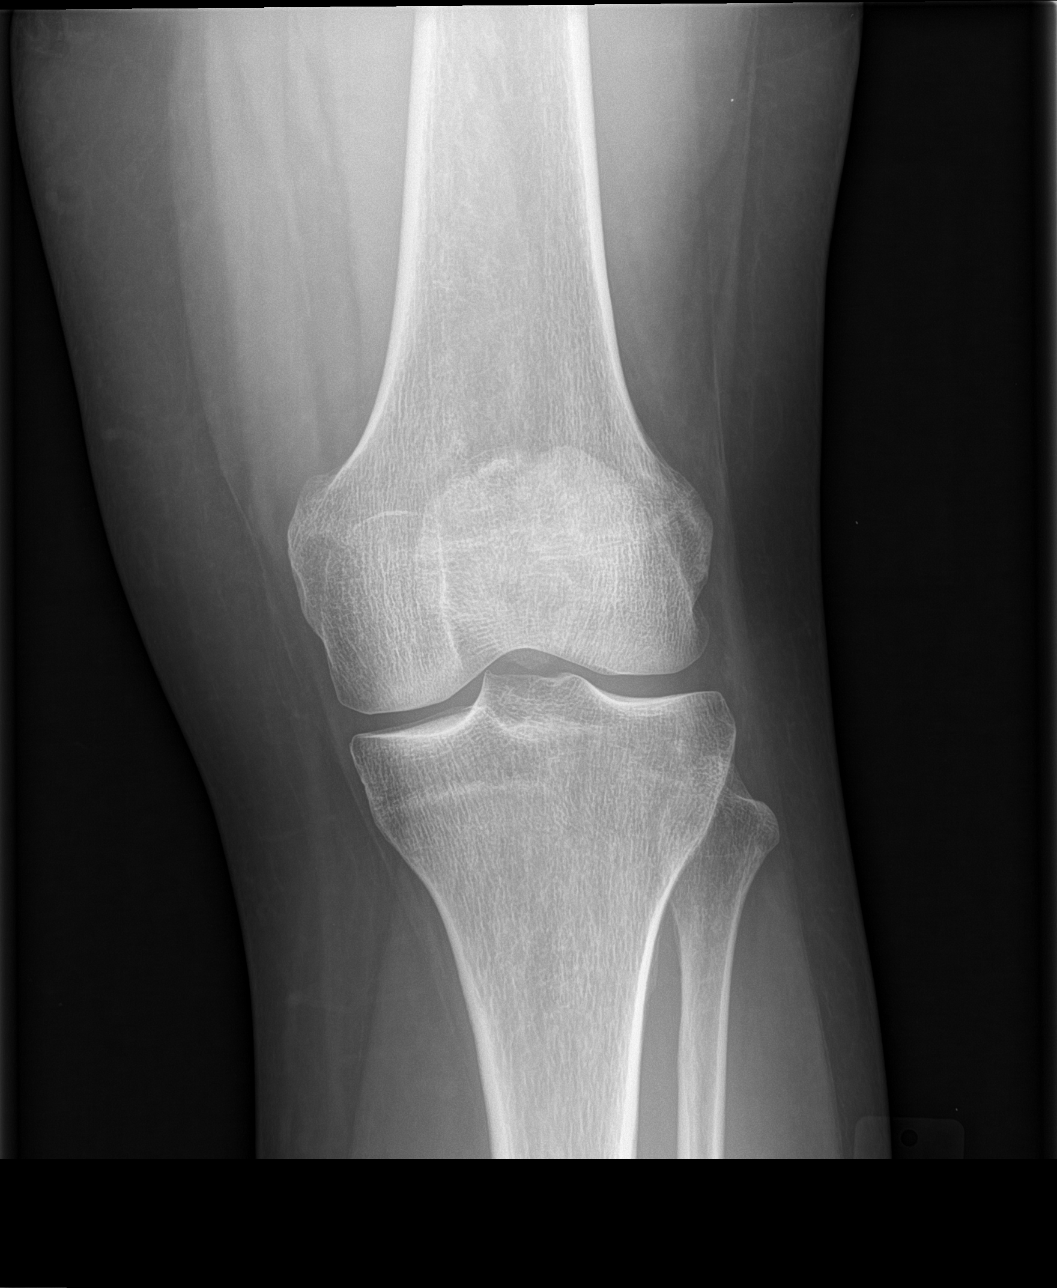

[knee obl (1 of 2)]
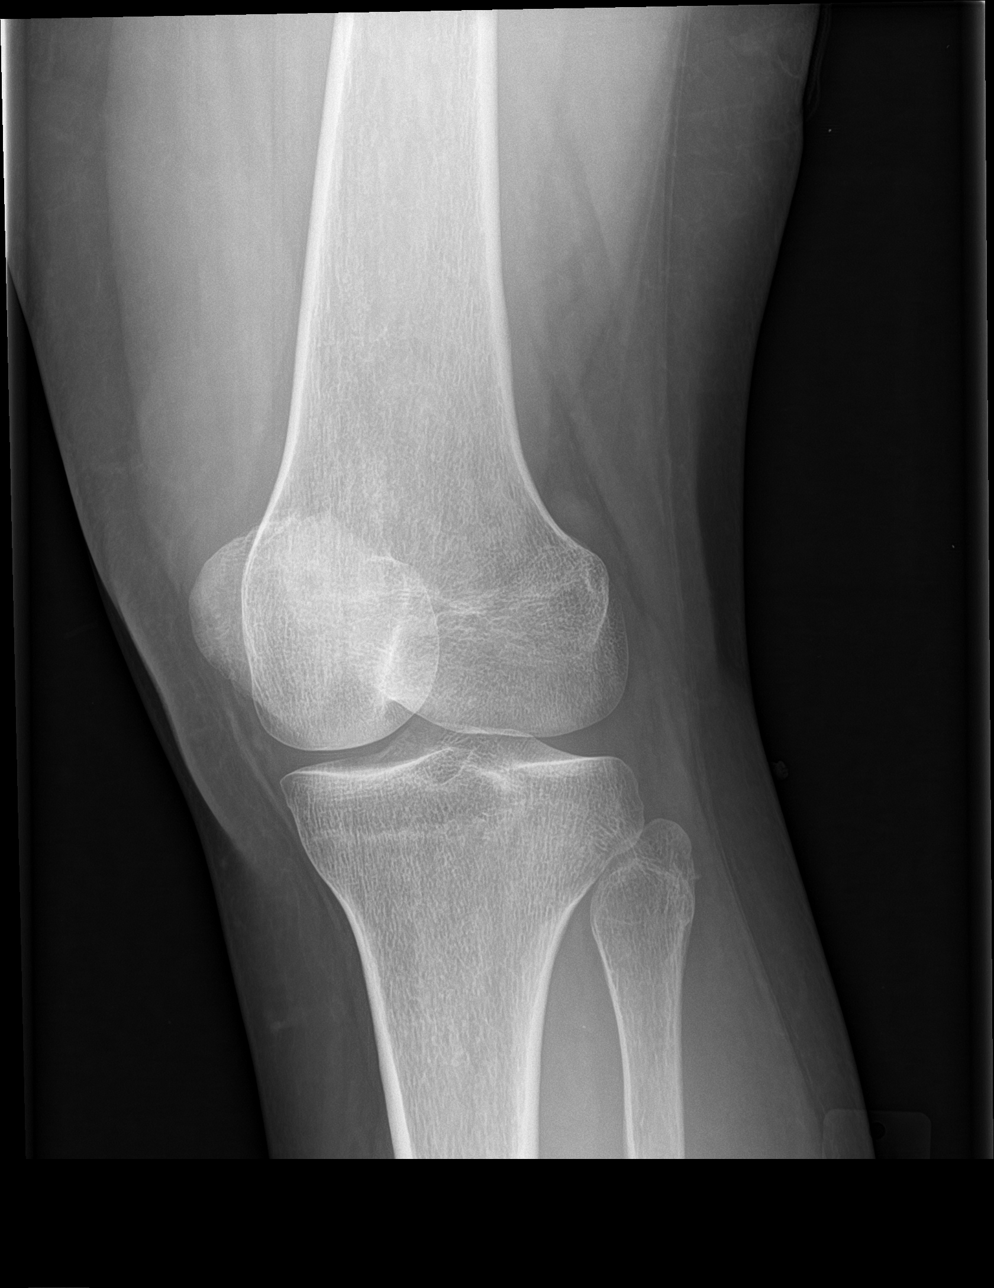

[knee obl (2 of 2)]
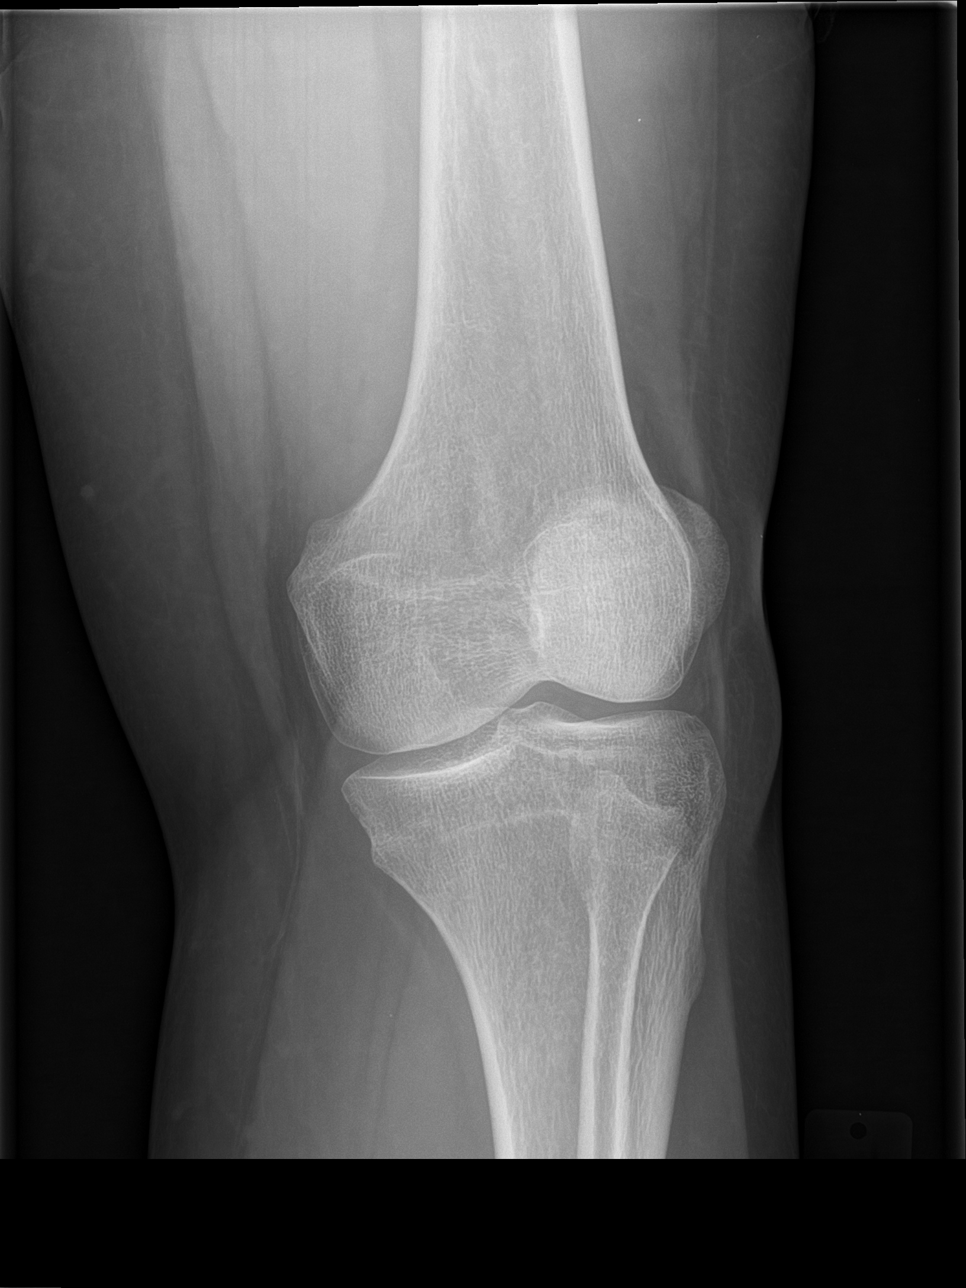

[knee lat]
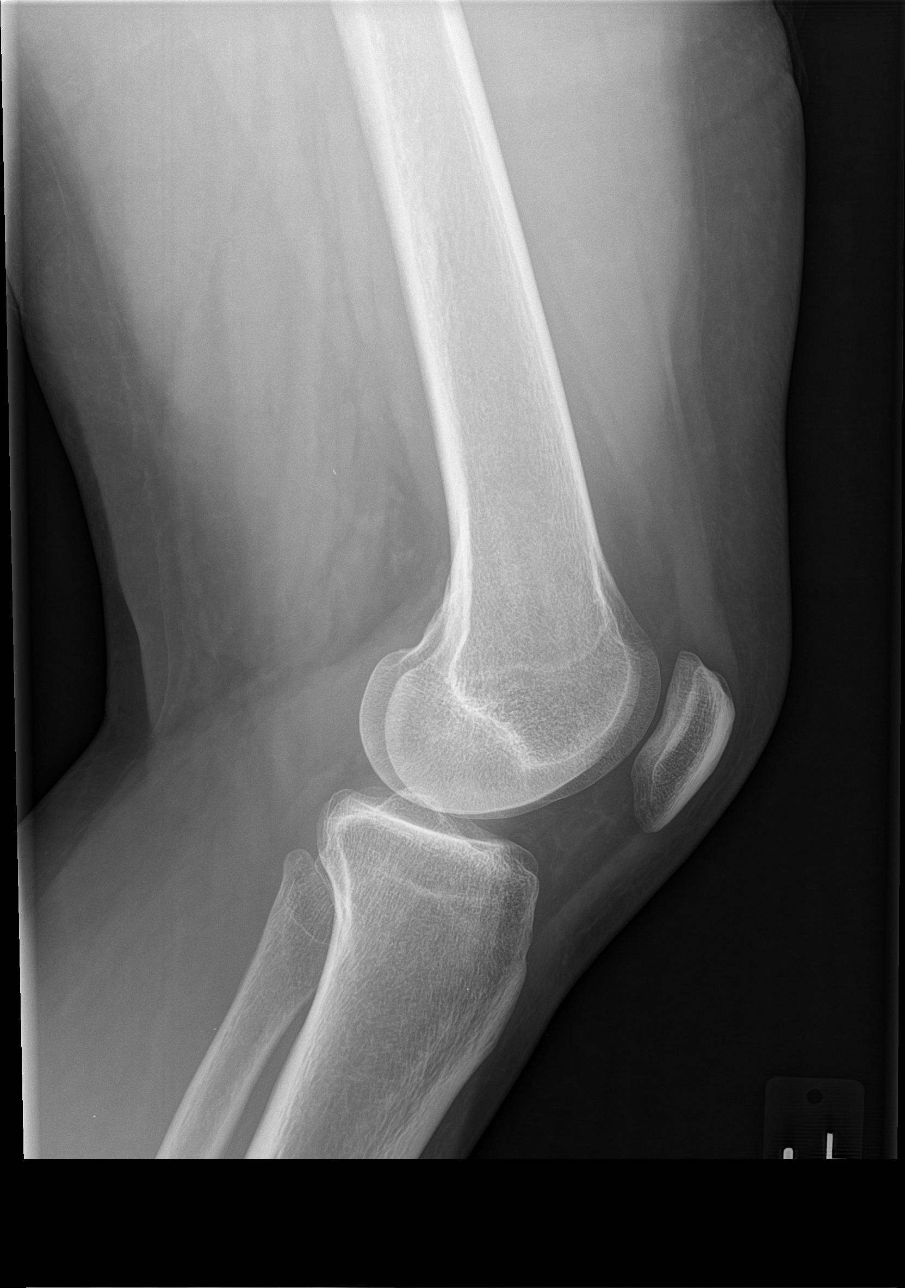

[4 of 4 positions shown; findings below may reference images not displayed]

FINDINGS: No evidence of fracture, dislocation, or joint effusion. No evidence
of arthropathy or other focal bone abnormality. Soft tissues are
unremarkable.
IMPRESSION: Negative.

## 2019-05-29 ENCOUNTER — Other Ambulatory Visit: Payer: Self-pay | Admitting: Family Medicine

## 2019-05-29 NOTE — Telephone Encounter (Signed)
Last OV 11/22/18 Zolpidem last filled 02/08/18 #30 with 3

## 2019-09-03 ENCOUNTER — Other Ambulatory Visit: Payer: Self-pay

## 2019-09-03 ENCOUNTER — Emergency Department (HOSPITAL_COMMUNITY): Payer: No Typology Code available for payment source

## 2019-09-03 ENCOUNTER — Encounter (HOSPITAL_COMMUNITY): Payer: Self-pay

## 2019-09-03 ENCOUNTER — Emergency Department (HOSPITAL_COMMUNITY)
Admission: EM | Admit: 2019-09-03 | Discharge: 2019-09-03 | Disposition: A | Discharge status: Home / Self Care | Payer: No Typology Code available for payment source | Attending: Emergency Medicine | Admitting: Emergency Medicine

## 2019-09-03 DIAGNOSIS — Y92410 Unspecified street and highway as the place of occurrence of the external cause: Secondary | ICD-10-CM | POA: Diagnosis not present

## 2019-09-03 DIAGNOSIS — M25511 Pain in right shoulder: Secondary | ICD-10-CM | POA: Insufficient documentation

## 2019-09-03 DIAGNOSIS — M79662 Pain in left lower leg: Secondary | ICD-10-CM | POA: Insufficient documentation

## 2019-09-03 DIAGNOSIS — M7918 Myalgia, other site: Secondary | ICD-10-CM

## 2019-09-03 DIAGNOSIS — M79645 Pain in left finger(s): Secondary | ICD-10-CM | POA: Diagnosis not present

## 2019-09-03 LAB — PREGNANCY, URINE: Preg Test, Ur: NEGATIVE

## 2019-09-03 MED ORDER — TETANUS-DIPHTH-ACELL PERTUSSIS 5-2.5-18.5 LF-MCG/0.5 IM SUSP
0.5000 mL | Freq: Once | INTRAMUSCULAR | Status: AC
  Administered 2019-09-03: 0.5 mL via INTRAMUSCULAR
  Filled 2019-09-03: qty 0.5

## 2019-09-03 MED ORDER — NAPROXEN 500 MG PO TABS
500.0000 mg | ORAL_TABLET | Freq: Two times a day (BID) | ORAL | 0 refills | Status: DC
Start: 2019-09-03 — End: 2021-03-25

## 2019-09-03 MED ORDER — METHOCARBAMOL 500 MG PO TABS: 500.0000 mg | ORAL_TABLET | Freq: Two times a day (BID) | ORAL | 0 refills | Status: DC

## 2019-09-03 NOTE — ED Notes (Signed)
An After Visit Summary was printed and given to the patient. Discharge instructions given and no further questions at this time.  
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
## 2019-09-03 NOTE — Discharge Instructions (Signed)
Your xrays were reassuring today - no signs of fractures. We have updated your tetanus in the ED today. Please pick up medications and take as prescribed. Do not drive while on the muscle relaxer as it can make you drowsy. I would recommend taking it at nighttime to help you sleep and taking the antiinflammatory during the day.   Please follow up with your PCP regarding your ED visit   Return to the ED for any worsening symptoms
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
## 2019-09-05 ENCOUNTER — Other Ambulatory Visit: Payer: Self-pay | Admitting: Family Medicine

## 2019-09-05 NOTE — Telephone Encounter (Signed)
Last OV 11/22/18 Zolpidem last filled 05/29/19 #30 with 0

## 2019-10-18 DIAGNOSIS — M25511 Pain in right shoulder: Secondary | ICD-10-CM | POA: Insufficient documentation

## 2020-03-31 ENCOUNTER — Other Ambulatory Visit: Payer: Self-pay | Admitting: Family Medicine

## 2020-04-04 ENCOUNTER — Telehealth (INDEPENDENT_AMBULATORY_CARE_PROVIDER_SITE_OTHER): Payer: 59 | Admitting: Family Medicine

## 2020-04-04 ENCOUNTER — Encounter: Payer: Self-pay | Admitting: Family Medicine

## 2020-04-04 ENCOUNTER — Other Ambulatory Visit: Payer: Self-pay

## 2020-04-04 DIAGNOSIS — R197 Diarrhea, unspecified: Secondary | ICD-10-CM

## 2020-04-04 DIAGNOSIS — R110 Nausea: Secondary | ICD-10-CM

## 2020-04-04 MED ORDER — ONDANSETRON HCL 4 MG PO TABS: 4.0000 mg | ORAL_TABLET | Freq: Three times a day (TID) | ORAL | 0 refills | Status: DC | PRN

## 2020-04-04 NOTE — Progress Notes (Signed)
I connected with  Carrie Diaz on 04/04/20 by a video enabled telemedicine application and verified that I am speaking with the correct person using two identifiers.   I discussed the limitations of evaluation and management by telemedicine. The patient expressed understanding and agreed to proceed.

## 2020-04-04 NOTE — Progress Notes (Signed)
Virtual Visit via Video   I connected with patient on 04/04/20 at  9:30 AM EST by a video enabled telemedicine application and verified that I am speaking with the correct person using two identifiers.  Location patient: Home Location provider: [facility] Persons participating in the virtual visit: Patient, Provider, CMA (Sabrina M)  I discussed the limitations of evaluation and management by telemedicine and the availability of in person appointments. The patient expressed understanding and agreed to proceed.  Subjective:   HPI:   Diarrhea- developed upset stomach Monday night.  Had pain with eating so has just been eating toast and crackers since then.  + nausea.  Stools were initially watery, now loose.  No BRBPR.  + chills, no fever.  Denies body aches.  Denies congestion, sore throat.  No known sick contacts but does work with public.  Has not worked since Sunday- had negative weekly COVID test last week (Tuesday).  Has test scheduled at work tomorrow.  ROS:   See pertinent positives and negatives per HPI.  Patient Active Problem List   Diagnosis Date Noted  . Pain of right shoulder joint on movement 10/18/2019  . Obesity (BMI 30-39.9) 09/28/2018  . Insomnia 02/08/2018  . Vitamin D deficiency 09/23/2017  . Pronator syndrome of right upper extremity 04/14/2013  . General medical exam 09/23/2011  . Cyst of Bartholin's gland , recurrent 06/18/2011  . Back pain 01/05/2011  . HAIR LOSS 12/17/2008  . Anxiety state 01/19/2008  . GERD 01/19/2008    Social History   Tobacco Use  . Smoking status: Never Smoker  . Smokeless tobacco: Never Used  Substance Use Topics  . Alcohol use: Yes    Alcohol/week: 2.0 standard drinks    Types: 2 Cans of beer per week    Current Outpatient Medications:  .  buPROPion (WELLBUTRIN XL) 150 MG 24 hr tablet, Take 3 tablets by mouth once daily, Disp: 270 tablet, Rfl: 0 .  Cholecalciferol (VITAMIN D3) 5000 units CAPS, Take by  mouth., Disp: , Rfl:  .  naproxen (NAPROSYN) 500 MG tablet, Take 1 tablet (500 mg total) by mouth 2 (two) times daily., Disp: 30 tablet, Rfl: 0 .  Omega-3 Fatty Acids (FISH OIL) 1200 MG CAPS, Take by mouth., Disp: , Rfl:  .  omeprazole (PRILOSEC) 20 MG capsule, Take 1 capsule by mouth once daily, Disp: 30 capsule, Rfl: 6 .  zolpidem (AMBIEN) 10 MG tablet, TAKE 1 TABLET BY MOUTH AT BEDTIME, Disp: 30 tablet, Rfl: 1 .  meloxicam (MOBIC) 15 MG tablet, Take 1 tablet (15 mg total) by mouth daily. (Patient not taking: Reported on 04/04/2020), Disp: 15 tablet, Rfl: 0 .  methocarbamol (ROBAXIN) 500 MG tablet, Take 1 tablet (500 mg total) by mouth 2 (two) times daily. (Patient not taking: Reported on 04/04/2020), Disp: 20 tablet, Rfl: 0 .  naproxen (NAPROSYN) 500 MG tablet, TAKE 1 TABLET BY MOUTH TWICE DAILY WITH A MEAL (Patient not taking: Reported on 04/04/2020), Disp: 60 tablet, Rfl: 4 .  ondansetron (ZOFRAN) 4 MG tablet, Take 1 tablet (4 mg total) by mouth every 8 (eight) hours as needed for nausea or vomiting. (Patient not taking: Reported on 04/04/2020), Disp: 30 tablet, Rfl: 0 .  valACYclovir (VALTREX) 500 MG tablet, Take 1 tablet (500 mg total) by mouth daily. (Patient not taking: Reported on 04/04/2020), Disp: 30 tablet, Rfl: 2  No Known Allergies  Objective:   There were no vitals taken for this visit.  AAOx3, NAD NCAT, EOMI No  obvious CN deficits Pt is able to speak clearly, coherently without shortness of breath or increased work of breathing.  Thought process is linear.  Mood is appropriate.   Assessment and Plan:   Nausea/diarrhea- new.  Pt denies sick contacts.  Had negative COVID test 9 days ago and is scheduled to retest tomorrow.  Told her that we have seen some COVID cases present as GI illness and to take appropriate precautions.  Start Zofran prn.  Encouraged increased fluid intake.  Imodium prn.  Reviewed supportive care and red flags that should prompt return.  Pt expressed  understanding and is in agreement w/ plan.    Neena Rhymes, MD 04/04/2020

## 2020-05-01 ENCOUNTER — Other Ambulatory Visit: Payer: Self-pay | Admitting: Family Medicine

## 2020-05-01 NOTE — Telephone Encounter (Signed)
Requesting:Ambien 10mg  Contract: UDS: Last Visit:04/04/20 v/v Next Visit:n/a Last Refill:09/06/19 30 tabs 1 refill  Please Advise

## 2020-07-09 ENCOUNTER — Other Ambulatory Visit: Payer: Self-pay | Admitting: Family Medicine

## 2020-07-09 NOTE — Telephone Encounter (Signed)
Requesting:Ambien 10mg  Contract: UDS: Last Visit:04/04/20 Next Visit:n/a Last Refill:05/01/20 30 tabs 0 refills  Please Advise

## 2020-08-19 ENCOUNTER — Other Ambulatory Visit: Payer: Self-pay | Admitting: Family Medicine

## 2020-09-26 ENCOUNTER — Other Ambulatory Visit: Payer: Self-pay | Admitting: Family Medicine

## 2020-09-26 NOTE — Telephone Encounter (Signed)
Requesting:Ambien 10mg  Contract: UDS: Last Visit:04/04/20 Next Visit:n/a Last Refill:07/09/20 30 tabs 0 refills  Please Advise

## 2020-10-02 ENCOUNTER — Encounter: Payer: Self-pay | Admitting: *Deleted

## 2020-12-19 ENCOUNTER — Other Ambulatory Visit: Payer: Self-pay | Admitting: Family Medicine

## 2020-12-24 ENCOUNTER — Other Ambulatory Visit: Payer: Self-pay | Admitting: Family Medicine

## 2020-12-30 ENCOUNTER — Other Ambulatory Visit: Payer: Self-pay

## 2020-12-30 DIAGNOSIS — G4700 Insomnia, unspecified: Secondary | ICD-10-CM

## 2020-12-30 MED ORDER — ZOLPIDEM TARTRATE 10 MG PO TABS: 10.0000 mg | ORAL_TABLET | Freq: Every day | ORAL | 0 refills | Status: DC

## 2020-12-30 NOTE — Telephone Encounter (Signed)
.   Encourage patient to contact the pharmacy for refills or they can request refills through [facility]  LAST APPOINTMENT DATE:  Please schedule appointment if longer than 1 year  NEXT APPOINTMENT DATE:  MEDICATION:zolpidem (AMBIEN) 10 MG tablet  Is the patient out of medication?   PHARMACY:[facility] [place], [place] - [address]  Let patient know to contact pharmacy at the end of the day to make sure medication is ready.  Please notify patient to allow 48-72 hours to process  CLINICAL FILLS OUT ALL BELOW:   LAST REFILL:  QTY:  REFILL DATE:    OTHER COMMENTS:    Okay for refill?  Please advise

## 2020-12-30 NOTE — Telephone Encounter (Signed)
Requesting:Ambien Contract: UDS: Last Visit:04/04/20 Next Visit:n/a Last Refill:09/26/20 30 tabs 0 refills  Please Advise

## 2021-03-25 ENCOUNTER — Ambulatory Visit (INDEPENDENT_AMBULATORY_CARE_PROVIDER_SITE_OTHER): Payer: 59 | Admitting: Family Medicine

## 2021-03-25 ENCOUNTER — Encounter: Payer: Self-pay | Admitting: Family Medicine

## 2021-03-25 VITALS — BP 112/74 | HR 79 | Temp 97.7°F | Resp 16 | Ht 67.0 in | Wt 181.6 lb

## 2021-03-25 DIAGNOSIS — M25511 Pain in right shoulder: Secondary | ICD-10-CM

## 2021-03-25 DIAGNOSIS — E559 Vitamin D deficiency, unspecified: Secondary | ICD-10-CM

## 2021-03-25 DIAGNOSIS — Z0000 Encounter for general adult medical examination without abnormal findings: Secondary | ICD-10-CM

## 2021-03-25 DIAGNOSIS — E663 Overweight: Secondary | ICD-10-CM

## 2021-03-25 DIAGNOSIS — G8929 Other chronic pain: Secondary | ICD-10-CM

## 2021-03-25 LAB — VITAMIN D 25 HYDROXY (VIT D DEFICIENCY, FRACTURES): VITD: 25.03 ng/mL — ABNORMAL LOW (ref 30.00–100.00)

## 2021-03-25 LAB — CBC WITH DIFFERENTIAL/PLATELET
Basophils Absolute: 0 10*3/uL (ref 0.0–0.1)
Basophils Relative: 0.8 % (ref 0.0–3.0)
Eosinophils Absolute: 0.1 10*3/uL (ref 0.0–0.7)
Eosinophils Relative: 1.7 % (ref 0.0–5.0)
HCT: 42.5 % (ref 36.0–46.0)
Hemoglobin: 14.1 g/dL (ref 12.0–15.0)
Lymphocytes Relative: 30.6 % (ref 12.0–46.0)
Lymphs Abs: 1.6 10*3/uL (ref 0.7–4.0)
MCHC: 33.1 g/dL (ref 30.0–36.0)
MCV: 92.6 fl (ref 78.0–100.0)
Monocytes Absolute: 0.3 10*3/uL (ref 0.1–1.0)
Monocytes Relative: 6.3 % (ref 3.0–12.0)
Neutro Abs: 3.2 10*3/uL (ref 1.4–7.7)
Neutrophils Relative %: 60.6 % (ref 43.0–77.0)
Platelets: 293 10*3/uL (ref 150.0–400.0)
RBC: 4.6 Mil/uL (ref 3.87–5.11)
RDW: 13.3 % (ref 11.5–15.5)
WBC: 5.3 10*3/uL (ref 4.0–10.5)

## 2021-03-25 LAB — LIPID PANEL
Cholesterol: 218 mg/dL — ABNORMAL HIGH (ref 0–200)
HDL: 61.3 mg/dL (ref >39.00)
LDL Cholesterol: 144 mg/dL — ABNORMAL HIGH (ref 0–99)
NonHDL: 156.95
Total CHOL/HDL Ratio: 4
Triglycerides: 64 mg/dL (ref 0.0–149.0)
VLDL: 12.8 mg/dL (ref 0.0–40.0)

## 2021-03-25 LAB — HEPATIC FUNCTION PANEL
ALT: 10 U/L (ref 0–35)
AST: 12 U/L (ref 0–37)
Albumin: 4 g/dL (ref 3.5–5.2)
Alkaline Phosphatase: 52 U/L (ref 39–117)
Bilirubin, Direct: 0.1 mg/dL (ref 0.0–0.3)
Total Bilirubin: 0.8 mg/dL (ref 0.2–1.2)
Total Protein: 6.8 g/dL (ref 6.0–8.3)

## 2021-03-25 LAB — BASIC METABOLIC PANEL
BUN: 11 mg/dL (ref 6–23)
CO2: 28 mEq/L (ref 19–32)
Calcium: 9.1 mg/dL (ref 8.4–10.5)
Chloride: 104 mEq/L (ref 96–112)
Creatinine, Ser: 0.79 mg/dL (ref 0.40–1.20)
GFR: 96.36 mL/min (ref >60.00)
Glucose, Bld: 84 mg/dL (ref 70–99)
Potassium: 4.5 mEq/L (ref 3.5–5.1)
Sodium: 137 mEq/L (ref 135–145)

## 2021-03-25 LAB — TSH: TSH: 1.3 u[IU]/mL (ref 0.35–5.50)

## 2021-03-25 NOTE — Progress Notes (Signed)
° °  Subjective:    Patient ID: Carrie Diaz, female    DOB: [birth year], 35 y.o.   MRN: [id number]  HPI CPE- UTD on Tdap.  Due for pap  Declines flu  Patient Care Team    Relationship Specialty Notifications Start End  Sheliah Hatch, MD PCP - General   03/19/10   Jaymes Graff, MD Consulting Physician Obstetrics and Gynecology  04/04/15     Health Maintenance  Topic Date Due   HIV Screening  Never done   Hepatitis C Screening  Never done   PAP SMEAR-Modifier  05/26/2020   COVID-19 Vaccine (1) 04/10/2021 (Originally [birth year]/1985)   INFLUENZA VACCINE  07/04/2021 (Originally 11/04/2020)   TETANUS/TDAP  09/02/2029   Pneumococcal Vaccine 75-79 Years old  Aged Out   HPV VACCINES  Aged Out    R sided shoulder pain- was injured at work over a year ago and hand dry needling and PT but pain has returned.  Is interested in having someone assess  Review of Systems Patient reports no vision/ hearing changes, adenopathy,fever, weight change,  persistant/recurrent hoarseness , swallowing issues, chest pain, palpitations, edema, persistant/recurrent cough, hemoptysis, dyspnea (rest/exertional/paroxysmal nocturnal), gastrointestinal bleeding (melena, rectal bleeding), abdominal pain, significant heartburn, bowel changes, GU symptoms (dysuria, hematuria, incontinence), Gyn symptoms (abnormal  bleeding, pain),  syncope, focal weakness, memory loss, numbness & tingling, skin/hair/nail changes, abnormal bruising or bleeding, anxiety, or depression.   This visit occurred during the SARS-CoV-2 public health emergency.  Safety protocols were in place, including screening questions prior to the visit, additional usage of staff PPE, and extensive cleaning of exam room while observing appropriate contact time as indicated for disinfecting solutions.      Objective:   Physical Exam General Appearance:    Alert, cooperative, no distress, appears stated age  Head:    Normocephalic, without obvious abnormality,  atraumatic  Eyes:    PERRL, conjunctiva/corneas clear, EOM's intact, fundi    benign, both eyes  Ears:    Normal TM's and external ear canals, both ears  Nose:   Deferred due to COVID  Throat:   Neck:   Supple, symmetrical, trachea midline, no adenopathy;    Thyroid: no enlargement/tenderness/nodules  Back:     Symmetric, no curvature, ROM normal, no CVA tenderness  Lungs:     Clear to auscultation bilaterally, respirations unlabored  Chest Wall:    No tenderness or deformity   Heart:    Regular rate and rhythm, S1 and S2 normal, no murmur, rub   or gallop  Breast Exam:    Deferred to GYN  Abdomen:     Soft, non-tender, bowel sounds active all four quadrants,    no masses, no organomegaly  Genitalia:    Deferred to GYN  Rectal:    Extremities:   Extremities normal, atraumatic, no cyanosis or edema  Pulses:   2+ and symmetric all extremities  Skin:   Skin color, texture, turgor normal, no rashes or lesions  Lymph nodes:   Cervical, supraclavicular, and axillary nodes normal  Neurologic:   CNII-XII intact, normal strength, sensation and reflexes    throughout          Assessment & Plan:

## 2021-03-25 NOTE — Assessment & Plan Note (Signed)
Pt's PE WNL.  Due for pap- pt to schedule.  UTD on Tdap.  Declines flu.  Check labs.  Anticipatory guidance provided.

## 2021-03-25 NOTE — Assessment & Plan Note (Signed)
Check labs and replete prn. 

## 2021-03-25 NOTE — Assessment & Plan Note (Signed)
Pt is down 25 lbs since last visit!  Applauded her efforts at healthy diet and regular exercise.  Check labs to risk stratify.  Will follow.

## 2021-03-25 NOTE — Patient Instructions (Addendum)
Follow up in 1 year or as needed We'll notify you of your lab results and make any changes if needed Continue to work on healthy diet and regular exercise- you look great! Call and schedule your pap w/ Dr Unice Bailey call you w/ your Ortho appt Call with any questions or concerns Stay Safe!  Stay Healthy! Happy Holidays!!

## 2021-03-27 ENCOUNTER — Telehealth: Payer: Self-pay

## 2021-03-27 MED ORDER — VITAMIN D (ERGOCALCIFEROL) 1.25 MG (50000 UNIT) PO CAPS
50000.0000 [IU] | ORAL_CAPSULE | ORAL | 0 refills | Status: DC
Start: 2021-03-27 — End: 2023-04-12

## 2021-03-27 NOTE — Telephone Encounter (Signed)
-----   Message from Sheliah Hatch, MD sent at 03/26/2021  7:42 AM EST ----- Labs look good w/ exception of low Vit D.  Based on this, we need to start prescription 50,000 units weekly x12 weeks in addition to daily OTC supplement of at least 2000 units.   Also, your total cholesterol and LDL are both higher than last check.  This will improve w/ healthy diet and regular exercise.  Will hold off on medication at this time as you are considering pregnancy.

## 2021-03-27 NOTE — Telephone Encounter (Signed)
Patient aware of labs. Vit D sent to pharmacy

## 2021-04-01 ENCOUNTER — Other Ambulatory Visit: Payer: Self-pay

## 2021-04-01 ENCOUNTER — Ambulatory Visit: Payer: Self-pay

## 2021-04-01 ENCOUNTER — Ambulatory Visit: Payer: 59 | Admitting: Orthopaedic Surgery

## 2021-04-01 ENCOUNTER — Encounter: Payer: Self-pay | Admitting: Orthopaedic Surgery

## 2021-04-01 VITALS — BP 105/69 | HR 63 | Ht 67.0 in | Wt 175.0 lb

## 2021-04-01 DIAGNOSIS — M25521 Pain in right elbow: Secondary | ICD-10-CM | POA: Diagnosis not present

## 2021-04-01 DIAGNOSIS — G8929 Other chronic pain: Secondary | ICD-10-CM

## 2021-04-01 DIAGNOSIS — M7541 Impingement syndrome of right shoulder: Secondary | ICD-10-CM

## 2021-04-01 DIAGNOSIS — M25511 Pain in right shoulder: Secondary | ICD-10-CM | POA: Diagnosis not present

## 2021-04-01 MED ORDER — BUPIVACAINE HCL 0.25 % IJ SOLN
4.0000 mL | INTRAMUSCULAR | Status: AC | PRN
  Administered 2021-04-01: 11:00:00 4 mL via INTRA_ARTICULAR

## 2021-04-01 MED ORDER — LIDOCAINE HCL 1 % IJ SOLN
0.5000 mL | INTRAMUSCULAR | Status: AC | PRN
Start: 2021-04-01 — End: 2021-04-01
  Administered 2021-04-01: 11:00:00 .5 mL

## 2021-04-01 MED ORDER — METHYLPREDNISOLONE ACETATE 40 MG/ML IJ SUSP
40.0000 mg | INTRAMUSCULAR | Status: AC | PRN
  Administered 2021-04-01: 11:00:00 40 mg via INTRA_ARTICULAR

## 2021-04-01 NOTE — Progress Notes (Signed)
Office Visit Note   Patient: Carrie Diaz           Date of Birth: 09-May-1985           MRN: [id number] Visit Date: 04/01/2021              Requested by: Sheliah Hatch, MD [address] [place],  [place] [address] PCP: Sheliah Hatch, MD   Assessment & Plan: Visit Diagnoses:  1. Chronic right shoulder pain   2. Pain in right elbow   3. Impingement syndrome of right shoulder     Plan: Acromial injection performed right shoulder which she tolerated well.  We will see how she does with this we discussed activities to avoid.  She will do better with the machines versus free weights.  We discussed particularly gripping the weights with high tension aggravating lateral epicondylitis type symptoms.  We discussed avoiding military presses.  Different ways to do triceps and deltoids.  We went over these sequentially.  If she has persistent problems she will call and we will need to proceed with an MRI scan right shoulder rule out partial rotator cuff tearing.  Follow-Up Instructions: Return if symptoms worsen or fail to improve.   Orders:  Orders Placed This Encounter  Procedures   Large Joint Inj   XR Shoulder Right   XR Elbow 2 Views Right   No orders of the defined types were placed in this encounter.     Procedures: Large Joint Inj: R subacromial bursa on 04/01/2021 11:25 AM Indications: pain Details: 22 G 1.5 in needle, lateral approach  Arthrogram: No  Medications: 4 mL bupivacaine 0.25 %; 40 mg methylPREDNISolone acetate 40 MG/ML; 0.5 mL lidocaine 1 % Outcome: tolerated well, no immediate complications Procedure, treatment alternatives, risks and benefits explained, specific risks discussed. Consent was given by the patient. Immediately prior to procedure a time out was called to verify the correct patient, procedure, equipment, support staff and site/side marked as required. Patient was prepped and draped in the usual sterile fashion.      Clinical  Data: No additional findings.   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  She had previous treatment with dry needling which seemed to help some.  Original Worker's Comp. injury she has never had an MRI scan.  She is taken some over-the-counter pain patches and Aleve.  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  She had some discomfort at the lateral epicondyle of the right arm and uses a tennis elbow brace.  Elbow really does not bother her as much as the shoulder.  She denies numbness or tingling in the fingers or hands.  Good cervical range of motion.  She states she works out sometimes 5 times per week.  She has not had a shoulder injection.  Review of Systems negative for diabetes.  Positive for right lateral epicondylitis, mild.  All other systems noncontributory to HPI.   Objective: Vital Signs: BP 105/69    Pulse 63    Ht 5\' 7"  (1.702 m)    Wt 175 lb (79.4 kg)    BMI 27.41 kg/m   Physical Exam Constitutional:      Appearance: She is well-developed.  HENT:     Head: Normocephalic.  Right Ear: External ear normal.     Left Ear: External ear normal. There is no impacted cerumen.  Eyes:     Pupils: Pupils are equal, round, and reactive to light.  Neck:     Thyroid: No thyromegaly.     Trachea: No tracheal deviation.  Cardiovascular:     Rate and Rhythm: Normal rate.  Pulmonary:     Effort: Pulmonary effort is normal.  Abdominal:     Palpations: Abdomen is soft.  Musculoskeletal:     Cervical back: No rigidity.  Skin:    General: Skin is warm and dry.  Neurological:     Mental Status: She is alert and oriented to person, place, and time.  Psychiatric:        Behavior: Behavior normal.    Ortho Exam no weakness with resisted wrist extension slight tenderness lateral  condyle right arm only no brachial plexus tenderness.  Some tenderness in supraspinatus fossa.  Resisted supraspinatus testing on the right shoulder is strong negative drop arm test moderate impingement.  Long head of the biceps is normal.  Good triceps strength.  Upper extremity reflexes 2+ and symmetrical.  Specialty Comments:  No specialty comments available.  Imaging: XR Elbow 2 Views Right  Result Date: 04/01/2021 2 view x-rays right elbow obtained and reviewed.  Normal elbow joint without acute or chronic degenerative changes.  No calcification of the lateral epicondyle region. Impression: Normal right elbow radiographs.  XR Shoulder Right  Result Date: 04/01/2021 Right shoulder multiple views demonstrate normal glenohumeral joint.  Minimal mild changes AC joint.  No soft tissue calcification. Impression: Right shoulder negative for acute or chronic degenerative changes.    PMFS History: Patient Active Problem List   Diagnosis Date Noted   Impingement syndrome of right shoulder 04/01/2021   Pain of right shoulder joint on movement 10/18/2019   Overweight (BMI 25.0-29.9) 09/28/2018   Insomnia 02/08/2018   Vitamin D deficiency 09/23/2017   Pronator syndrome of right upper extremity 04/14/2013   General medical exam 09/23/2011   Back pain 01/05/2011   Anxiety state 01/19/2008   GERD 01/19/2008   Past Medical History:  Diagnosis Date   Anxiety    has prn med for anxiety   Bipolar disorder (HCC)    Cyst of Bartholin's gland 05/02/10   GERD (gastroesophageal reflux disease)    H/O dysmenorrhea 05/02/10   H/O varicella    LGSIL (low grade squamous intraepithelial lesion) on Pap smear 06/04/11   HPV/MILD DYSPLASIA/CIN1   Mental disorder    bi-polar   Vaginal cyst    Yeast infection     Family History  Problem Relation Age of Onset   Alcohol abuse Other    Depression Other    Diabetes Other    Depression Mother        AND ANXIETY    Past Surgical History:  Procedure  Laterality Date   BARTHOLIN CYST MARSUPIALIZATION  07/23/2011   Procedure: BARTHOLIN CYST MARSUPIALIZATION;  Surgeon: Michael Litter, MD;  Location: [facility];  Service: Gynecology;  Laterality: N/A;  Removal   NO PAST SURGERIES     VEIN LIGATION AND STRIPPING     WISDOM TOOTH EXTRACTION     Social History   Occupational History   Occupation: [employer]PART[employer]    Employer: [employer]  Tobacco Use   Smoking status: Never   Smokeless tobacco: Never  Substance and Sexual Activity   Alcohol use: Yes    Alcohol/week: 2.0 standard drinks  Types: 2 Cans of beer per week   Drug use: No   Sexual activity: Yes    Birth control/protection: None

## 2021-04-11 ENCOUNTER — Other Ambulatory Visit: Payer: Self-pay | Admitting: Family Medicine

## 2021-04-11 DIAGNOSIS — G4700 Insomnia, unspecified: Secondary | ICD-10-CM

## 2021-06-04 ENCOUNTER — Other Ambulatory Visit: Payer: Self-pay | Admitting: Family Medicine

## 2021-06-04 DIAGNOSIS — G4700 Insomnia, unspecified: Secondary | ICD-10-CM

## 2021-09-24 LAB — HM PAP SMEAR

## 2021-09-25 ENCOUNTER — Encounter: Payer: Self-pay | Admitting: Family Medicine

## 2021-10-20 ENCOUNTER — Encounter: Payer: Self-pay | Admitting: Family Medicine

## 2022-01-10 ENCOUNTER — Other Ambulatory Visit: Payer: Self-pay | Admitting: Family Medicine

## 2022-01-10 DIAGNOSIS — G4700 Insomnia, unspecified: Secondary | ICD-10-CM

## 2022-01-12 NOTE — Telephone Encounter (Signed)
Zolipidem 10 mg LOV: 03/25/21 Last Refill:06/23/21 Upcoming appt: 03/07/22

## 2022-02-06 ENCOUNTER — Other Ambulatory Visit: Payer: Self-pay | Admitting: Obstetrics and Gynecology

## 2022-03-26 ENCOUNTER — Ambulatory Visit (INDEPENDENT_AMBULATORY_CARE_PROVIDER_SITE_OTHER): Payer: 59 | Admitting: Family Medicine

## 2022-03-26 ENCOUNTER — Encounter: Payer: Self-pay | Admitting: Family Medicine

## 2022-03-26 VITALS — BP 128/80 | HR 67 | Temp 98.4°F | Resp 17 | Ht 67.0 in | Wt 207.1 lb

## 2022-03-26 DIAGNOSIS — Z0000 Encounter for general adult medical examination without abnormal findings: Secondary | ICD-10-CM | POA: Diagnosis not present

## 2022-03-26 DIAGNOSIS — E559 Vitamin D deficiency, unspecified: Secondary | ICD-10-CM | POA: Diagnosis not present

## 2022-03-26 DIAGNOSIS — E663 Overweight: Secondary | ICD-10-CM

## 2022-03-26 LAB — HEPATIC FUNCTION PANEL
ALT: 15 U/L (ref 0–35)
AST: 17 U/L (ref 0–37)
Albumin: 4.4 g/dL (ref 3.5–5.2)
Alkaline Phosphatase: 51 U/L (ref 39–117)
Bilirubin, Direct: 0.1 mg/dL (ref 0.0–0.3)
Total Bilirubin: 0.6 mg/dL (ref 0.2–1.2)
Total Protein: 7.7 g/dL (ref 6.0–8.3)

## 2022-03-26 LAB — VITAMIN D 25 HYDROXY (VIT D DEFICIENCY, FRACTURES): VITD: 28.42 ng/mL — ABNORMAL LOW (ref 30.00–100.00)

## 2022-03-26 LAB — BASIC METABOLIC PANEL
BUN: 14 mg/dL (ref 6–23)
CO2: 25 mEq/L (ref 19–32)
Calcium: 9.4 mg/dL (ref 8.4–10.5)
Chloride: 105 mEq/L (ref 96–112)
Creatinine, Ser: 0.86 mg/dL (ref 0.40–1.20)
GFR: 86.41 mL/min (ref >60.00)
Glucose, Bld: 80 mg/dL (ref 70–99)
Potassium: 3.8 mEq/L (ref 3.5–5.1)
Sodium: 136 mEq/L (ref 135–145)

## 2022-03-26 LAB — CBC WITH DIFFERENTIAL/PLATELET
Basophils Absolute: 0 10*3/uL (ref 0.0–0.1)
Basophils Relative: 0.7 % (ref 0.0–3.0)
Eosinophils Absolute: 0.1 10*3/uL (ref 0.0–0.7)
Eosinophils Relative: 2.3 % (ref 0.0–5.0)
HCT: 44.4 % (ref 36.0–46.0)
Hemoglobin: 15 g/dL (ref 12.0–15.0)
Lymphocytes Relative: 31.6 % (ref 12.0–46.0)
Lymphs Abs: 1.9 10*3/uL (ref 0.7–4.0)
MCHC: 33.9 g/dL (ref 30.0–36.0)
MCV: 92.3 fl (ref 78.0–100.0)
Monocytes Absolute: 0.4 10*3/uL (ref 0.1–1.0)
Monocytes Relative: 7.5 % (ref 3.0–12.0)
Neutro Abs: 3.4 10*3/uL (ref 1.4–7.7)
Neutrophils Relative %: 57.9 % (ref 43.0–77.0)
Platelets: 310 10*3/uL (ref 150.0–400.0)
RBC: 4.81 Mil/uL (ref 3.87–5.11)
RDW: 13.2 % (ref 11.5–15.5)
WBC: 5.9 10*3/uL (ref 4.0–10.5)

## 2022-03-26 LAB — LIPID PANEL
Cholesterol: 228 mg/dL — ABNORMAL HIGH (ref 0–200)
HDL: 56.8 mg/dL (ref >39.00)
LDL Cholesterol: 147 mg/dL — ABNORMAL HIGH (ref 0–99)
NonHDL: 171.57
Total CHOL/HDL Ratio: 4
Triglycerides: 122 mg/dL (ref 0.0–149.0)
VLDL: 24.4 mg/dL (ref 0.0–40.0)

## 2022-03-26 LAB — TSH: TSH: 1.91 u[IU]/mL (ref 0.35–5.50)

## 2022-03-26 NOTE — Assessment & Plan Note (Signed)
Pt reports she is typically 189 lbs at home w/o her full uniform on.  Encouraged low carb diet and regular exercise.  Check labs to risk stratify.  Will follow.

## 2022-03-26 NOTE — Assessment & Plan Note (Signed)
Pt's PE WNL w/ exception of BMI.  UTD on pap, Tdap.  Check labs.  Anticipatory guidance provided.  

## 2022-03-26 NOTE — Assessment & Plan Note (Signed)
Check labs and replete prn. 

## 2022-03-26 NOTE — Patient Instructions (Signed)
Follow up in 1 year or as needed We'll notify you of your lab results and make any changes if needed Continue to work on healthy diet and regular exercise- you can do it! Call with any questions or concerns Stay Safe!  Stay Healthy! Happy Holidays!!! GOOD LUCK!!!

## 2022-03-26 NOTE — Progress Notes (Signed)
   Subjective:    Patient ID: Carrie Diaz, female    DOB: 1985-11-13, 36 y.o.   MRN: [id number]  HPI CPE- UTD on pap, Tdap  Patient Care Team    Relationship Specialty Notifications Start End  Sheliah Hatch, MD PCP - General   03/19/10   Jaymes Graff, MD Consulting Physician Obstetrics and Gynecology  04/04/15     Health Maintenance  Topic Date Due   INFLUENZA VACCINE  07/05/2022 (Originally 11/04/2021)   HIV Screening  03/27/2023 (Originally 12/30/1999)   PAP SMEAR-Modifier  09/24/2024   DTaP/Tdap/Td (3 - Td or Tdap) 09/02/2029   HPV VACCINES  Aged Out   COVID-19 Vaccine  Discontinued   Hepatitis C Screening  Discontinued      Review of Systems Patient reports no vision/ hearing changes, adenopathy, fever, persistant/recurrent hoarseness , swallowing issues, chest pain, palpitations, edema, persistant/recurrent cough, hemoptysis, dyspnea (rest/exertional/paroxysmal nocturnal), gastrointestinal bleeding (melena, rectal bleeding), abdominal pain, significant heartburn, bowel changes, GU symptoms (dysuria, hematuria, incontinence), Gyn symptoms (abnormal  bleeding, pain),  syncope, focal weakness, memory loss, numbness & tingling, skin/hair/nail changes, abnormal bruising or bleeding, anxiety, or depression.   + weight gain- pt says she is 189 lb at home, wearing full uniform today in office    Objective:   Physical Exam General Appearance:    Alert, cooperative, no distress, appears stated age  Head:    Normocephalic, without obvious abnormality, atraumatic  Eyes:    PERRL, conjunctiva/corneas clear, EOM's intact both eyes  Ears:    Normal TM's and external ear canals, both ears  Nose:   Nares normal, septum midline, mucosa normal, no drainage    or sinus tenderness  Throat:   Lips, mucosa, and tongue normal; teeth and gums normal  Neck:   Supple, symmetrical, trachea midline, no adenopathy;    Thyroid: no enlargement/tenderness/nodules  Back:     Symmetric, no curvature,  ROM normal, no CVA tenderness  Lungs:     Clear to auscultation bilaterally, respirations unlabored  Chest Wall:    No tenderness or deformity   Heart:    Regular rate and rhythm, S1 and S2 normal, no murmur, rub   or gallop  Breast Exam:    Deferred to GYN  Abdomen:     Soft, non-tender, bowel sounds active all four quadrants,    no masses, no organomegaly  Genitalia:    Deferred to GYN  Rectal:    Extremities:   Extremities normal, atraumatic, no cyanosis or edema  Pulses:   2+ and symmetric all extremities  Skin:   Skin color, texture, turgor normal, no rashes or lesions  Lymph nodes:   Cervical, supraclavicular, and axillary nodes normal  Neurologic:   CNII-XII intact, normal strength, sensation and reflexes    throughout          Assessment & Plan:

## 2022-03-27 ENCOUNTER — Telehealth: Payer: Self-pay

## 2022-03-27 NOTE — Telephone Encounter (Signed)
Informed pt of lab results  

## 2022-03-27 NOTE — Telephone Encounter (Signed)
-----   Message from Sheliah Hatch, MD sent at 03/27/2022  7:51 AM EST ----- Labs look great w/ exception of elevated total cholesterol and LDL (bad cholesterol).  These will improve w/ healthy diet and regular exercise.  Will not start meds as the plan is to have a baby!  Your Vit D is slightly low.  Please add a daily OTC supplement of 5,000 units daily.

## 2022-06-04 ENCOUNTER — Other Ambulatory Visit: Payer: Self-pay | Admitting: Family Medicine

## 2022-06-04 DIAGNOSIS — G4700 Insomnia, unspecified: Secondary | ICD-10-CM

## 2022-06-05 NOTE — Telephone Encounter (Signed)
Pt aware that Rx has been sent in

## 2022-06-05 NOTE — Telephone Encounter (Signed)
Ambien 10 mg LOV: 03/26/22 Last Refill:01/12/22 Upcoming appt: none

## 2022-10-22 ENCOUNTER — Other Ambulatory Visit: Payer: Self-pay | Admitting: Family Medicine

## 2022-10-22 DIAGNOSIS — G4700 Insomnia, unspecified: Secondary | ICD-10-CM

## 2022-10-23 NOTE — Telephone Encounter (Signed)
Patient is requesting a refill of the following medications: Requested Prescriptions   Pending Prescriptions Disp Refills   zolpidem (AMBIEN) 10 MG tablet [Pharmacy Med Name: Zolpidem Tartrate 10 MG Oral Tablet] 30 tablet 0    Sig: TAKE 1 TABLET BY MOUTH AT BEDTIME    Date of patient request: 10/23/2022 Last office visit: 03/26/2022 Date of last refill: 06/05/2022 Last refill amount: 30 Follow up time period per chart: 6 months

## 2023-03-06 ENCOUNTER — Other Ambulatory Visit: Payer: Self-pay

## 2023-03-06 ENCOUNTER — Encounter: Payer: Self-pay | Admitting: Intensive Care

## 2023-03-06 ENCOUNTER — Emergency Department
Admission: EM | Admit: 2023-03-06 | Discharge: 2023-03-07 | Disposition: A | Discharge status: Home / Self Care | Payer: 59 | Attending: Emergency Medicine | Admitting: Emergency Medicine

## 2023-03-06 ENCOUNTER — Emergency Department: Payer: 59

## 2023-03-06 DIAGNOSIS — Z3A08 8 weeks gestation of pregnancy: Secondary | ICD-10-CM | POA: Insufficient documentation

## 2023-03-06 DIAGNOSIS — O209 Hemorrhage in early pregnancy, unspecified: Secondary | ICD-10-CM | POA: Insufficient documentation

## 2023-03-06 DIAGNOSIS — O4690 Antepartum hemorrhage, unspecified, unspecified trimester: Secondary | ICD-10-CM

## 2023-03-06 LAB — CBC WITH DIFFERENTIAL/PLATELET
Abs Immature Granulocytes: 0.03 10*3/uL (ref 0.00–0.07)
Basophils Absolute: 0.1 10*3/uL (ref 0.0–0.1)
Basophils Relative: 1 %
Eosinophils Absolute: 0.1 10*3/uL (ref 0.0–0.5)
Eosinophils Relative: 1 %
HCT: 41.4 % (ref 36.0–46.0)
Hemoglobin: 14.1 g/dL (ref 12.0–15.0)
Immature Granulocytes: 0 %
Lymphocytes Relative: 25 %
Lymphs Abs: 2.3 10*3/uL (ref 0.7–4.0)
MCH: 31.3 pg (ref 26.0–34.0)
MCHC: 34.1 g/dL (ref 30.0–36.0)
MCV: 91.8 fL (ref 80.0–100.0)
Monocytes Absolute: 0.7 10*3/uL (ref 0.1–1.0)
Monocytes Relative: 7 %
Neutro Abs: 6 10*3/uL (ref 1.7–7.7)
Neutrophils Relative %: 66 %
Platelets: 368 10*3/uL (ref 150–400)
RBC: 4.51 MIL/uL (ref 3.87–5.11)
RDW: 12.6 % (ref 11.5–15.5)
WBC: 9.3 10*3/uL (ref 4.0–10.5)
nRBC: 0 % (ref 0.0–0.2)

## 2023-03-06 LAB — URINALYSIS, ROUTINE W REFLEX MICROSCOPIC
Bacteria, UA: NONE SEEN
Bilirubin Urine: NEGATIVE
Glucose, UA: NEGATIVE mg/dL
Ketones, ur: NEGATIVE mg/dL
Leukocytes,Ua: NEGATIVE
Nitrite: NEGATIVE
Protein, ur: 30 mg/dL — AB
RBC / HPF: >50 RBC/hpf (ref 0–5)
Specific Gravity, Urine: 1.028 (ref 1.005–1.030)
pH: 5 (ref 5.0–8.0)

## 2023-03-06 LAB — BASIC METABOLIC PANEL
Anion gap: 7 (ref 5–15)
BUN: 12 mg/dL (ref 6–20)
CO2: 22 mmol/L (ref 22–32)
Calcium: 8.6 mg/dL — ABNORMAL LOW (ref 8.9–10.3)
Chloride: 106 mmol/L (ref 98–111)
Creatinine, Ser: 0.75 mg/dL (ref 0.44–1.00)
GFR, Estimated: >60 mL/min (ref >60)
Glucose, Bld: 90 mg/dL (ref 70–99)
Potassium: 4.1 mmol/L (ref 3.5–5.1)
Sodium: 135 mmol/L (ref 135–145)

## 2023-03-06 LAB — POC URINE PREG, ED: Preg Test, Ur: POSITIVE — AB

## 2023-03-06 LAB — HCG, QUANTITATIVE, PREGNANCY: hCG, Beta Chain, Quant, S: 11375 m[IU]/mL — ABNORMAL HIGH (ref <5)

## 2023-03-06 LAB — ABO/RH: ABO/RH(D): A POS

## 2023-03-06 MED ORDER — ACETAMINOPHEN 325 MG PO TABS
650.0000 mg | ORAL_TABLET | Freq: Once | ORAL | Status: AC
  Administered 2023-03-06: 650 mg via ORAL
  Filled 2023-03-06: qty 2

## 2023-03-06 NOTE — ED Provider Notes (Signed)
[facility] Provider Note    Event Date/Time   First MD Initiated Contact with Patient 03/06/23 1944     (approximate)   History   Vaginal Bleeding   HPI  Carrie Diaz is a 37 year old female G2P1 at estimated [redacted] weeks gestation presenting to the emergency department for evaluation of vaginal bleeding.  Around noon, patient had onset of vaginal bleeding with passage of clots.  Initially brisk changing pads every 2 hours or so, since then has improved.  She did undergo IVF and is seeing a fertility doctor.  She spoke with her doctor who recommended ER presentation as she would not be able to see her until Monday.  Patient reports some mild lower abdominal cramping, no other complaints.      Physical Exam   Triage Vital Signs: ED Triage Vitals  Encounter Vitals Group     BP 03/06/23 1832 122/87     Systolic BP Percentile --      Diastolic BP Percentile --      Pulse Rate 03/06/23 1832 (!) 57     Resp 03/06/23 1832 18     Temp 03/06/23 1832 98.4 F (36.9 C)     Temp Source 03/06/23 1832 Oral     SpO2 03/06/23 1832 99 %     Weight 03/06/23 1833 190 lb (86.2 kg)     Height 03/06/23 1833 5\' 8"  (1.727 m)     Head Circumference --      Peak Flow --      Pain Score 03/06/23 1832 2     Pain Loc --      Pain Education --      Exclude from Growth Chart --     Most recent vital signs: Vitals:   03/06/23 1832 03/06/23 2137  BP: 122/87 105/74  Pulse: (!) 57 67  Resp: 18 17  Temp: 98.4 F (36.9 C) 98.2 F (36.8 C)  SpO2: 99% 100%     General: Awake, interactive  CV:  Regular rate, good peripheral perfusion.  Resp:  Unlabored respirations.  Abd:  Nondistended, soft, mild tenderness over the lower abdomen GU:  Pelvic declined by patient Neuro:  Symmetric facial movement, fluid speech   ED Results / Procedures / Treatments   Labs (all labs ordered are listed, but only abnormal results are displayed) Labs Reviewed  HCG, QUANTITATIVE, PREGNANCY  - Abnormal; Notable for the following components:      Result Value   hCG, Beta Chain, Quant, S 11,375 (*)    All other components within normal limits  URINALYSIS, ROUTINE W REFLEX MICROSCOPIC - Abnormal; Notable for the following components:   Color, Urine YELLOW (*)    APPearance HAZY (*)    Hgb urine dipstick LARGE (*)    Protein, ur 30 (*)    All other components within normal limits  BASIC METABOLIC PANEL - Abnormal; Notable for the following components:   Calcium 8.6 (*)    All other components within normal limits  POC URINE PREG, ED - Abnormal; Notable for the following components:   Preg Test, Ur POSITIVE (*)    All other components within normal limits  CBC WITH DIFFERENTIAL/PLATELET  ABO/RH     EKG EKG independently reviewed interpreted by myself (ER attending) demonstrates:    RADIOLOGY Imaging independently reviewed and interpreted by myself demonstrates:    PROCEDURES:  Critical Care performed: No  Procedures   MEDICATIONS ORDERED IN ED: Medications  acetaminophen (TYLENOL) tablet 650 mg (650  mg Oral Given 03/06/23 2249)     IMPRESSION / MDM / ASSESSMENT AND PLAN / ED COURSE  I reviewed the triage vital signs and the nursing notes.  Differential diagnosis includes, but is not limited to, vaginal bleeding in early pregnancy, threatened spontaneous abortion, completed miscarriage, ectopic pregnancy  Patient's presentation is most consistent with acute presentation with potential threat to life or bodily function.  37 year old female presenting to the emergency department for evaluation of vaginal bleeding in early pregnancy.  Stable vitals here.  Labs reassuring including hemoglobin of 14.1.  Urine without evidence of infection.  UPT positive.  Beta hCG 11,000.  Rh+, no indication for RhoGAM.  Patient reports that her beta-hCG was 4000 earlier this week. Ultrasound here without visible gestational sac, yolk sac, cardiac activity consistent with pregnancy  of unknown location with radiology reporting considerations for early IUP, spontaneous abortion, occult ectopic pregnancy.  Case was reviewed with Dr. Feliberto Gottron with OB/GYN.  In the absence of significant abdominal pain or ongoing significant hemorrhage, he did not recommend acute intervention.  He did recommend a repeat beta-hCG in 48 hours.  I did discuss this recommendation with the patient.  She reports that she will be able to see her fertility doctor on Monday.  She received Tylenol for abdominal discomfort with improvement.  No worsening in her bleeding.  Strict return precautions were provided for worsening abdominal pain, syncope, heavy vaginal bleeding, or other new or concerning symptoms.  Patient was discharged stable condition.     FINAL CLINICAL IMPRESSION(S) / ED DIAGNOSES   Final diagnoses:  Vaginal bleeding in pregnancy     Rx / DC Orders   ED Discharge Orders     None        Note:  This document was prepared using Dragon voice recognition software and may include unintentional dictation errors.   Trinna Post, MD 03/06/23 [phone]

## 2023-03-06 NOTE — ED Triage Notes (Signed)
Patient reports vaginal bleeding with 2nd pregnancy. Reports she is [redacted]weeks pregnant. Reports heavy bleeding with clots that started today around noon. Reports seeing a fertility doctor  Takes lovenox daily for blood clotting disorder  Miscarriage in June 24'

## 2023-03-06 NOTE — Discharge Instructions (Signed)
Your ultrasound today did not show the location of your pregnancy.  This could be related to a miscarriage or an early pregnancy or an ectopic pregnancy.  Please see your fertility doctor on Monday for repeat beta hCG.  If you have worsening vaginal bleeding, worsening abdominal pain, passing out, or any other new or concerning symptoms, please return to the ER for further evaluation.

## 2023-04-12 ENCOUNTER — Encounter: Payer: Self-pay | Admitting: Family Medicine

## 2023-04-12 ENCOUNTER — Ambulatory Visit (INDEPENDENT_AMBULATORY_CARE_PROVIDER_SITE_OTHER): Payer: 59 | Admitting: Family Medicine

## 2023-04-12 VITALS — BP 122/72 | HR 81 | Temp 97.8°F | Ht 68.0 in | Wt 202.0 lb

## 2023-04-12 DIAGNOSIS — Z0000 Encounter for general adult medical examination without abnormal findings: Secondary | ICD-10-CM | POA: Diagnosis not present

## 2023-04-12 DIAGNOSIS — F419 Anxiety disorder, unspecified: Secondary | ICD-10-CM

## 2023-04-12 DIAGNOSIS — F32A Depression, unspecified: Secondary | ICD-10-CM

## 2023-04-12 DIAGNOSIS — E559 Vitamin D deficiency, unspecified: Secondary | ICD-10-CM | POA: Diagnosis not present

## 2023-04-12 DIAGNOSIS — Z1159 Encounter for screening for other viral diseases: Secondary | ICD-10-CM

## 2023-04-12 DIAGNOSIS — Z114 Encounter for screening for human immunodeficiency virus [HIV]: Secondary | ICD-10-CM

## 2023-04-12 DIAGNOSIS — E669 Obesity, unspecified: Secondary | ICD-10-CM

## 2023-04-12 MED ORDER — BUPROPION HCL ER (XL) 150 MG PO TB24: 150.0000 mg | ORAL_TABLET | Freq: Every day | ORAL | 3 refills | Status: DC

## 2023-04-12 MED ORDER — VALACYCLOVIR HCL 500 MG PO TABS: 500.0000 mg | ORAL_TABLET | Freq: Every day | ORAL | 0 refills | Status: DC

## 2023-04-12 NOTE — Progress Notes (Signed)
   Subjective:    Patient ID: Carrie Diaz, female    DOB: 18-Mar-1986, 38 y.o.   MRN: [id number]  HPI CPE- UTD on pap, Tdap.  Declines flu.  Patient Care Team    Relationship Specialty Notifications Start End  Mahlon Comer BRAVO, MD PCP - Diaz   03/19/10   Armond Cape, MD Consulting Physician Obstetrics and Gynecology  04/04/15     Health Maintenance  Topic Date Due   HIV Screening  Never done   Hepatitis C Screening  Never done   INFLUENZA VACCINE  11/05/2022   COVID-19 Vaccine (1 - 2024-25 season) Never done   Cervical Cancer Screening (HPV/Pap Cotest)  09/24/2024   DTaP/Tdap/Td (3 - Td or Tdap) 09/02/2029   HPV VACCINES  Aged Out      Review of Systems Patient reports no vision/ hearing changes, adenopathy,fever, weight change,  persistant/recurrent hoarseness , swallowing issues, chest pain, palpitations, edema, persistant/recurrent cough, hemoptysis, dyspnea (rest/exertional/paroxysmal nocturnal), gastrointestinal bleeding (melena, rectal bleeding), abdominal pain, significant heartburn, bowel changes, GU symptoms (dysuria, hematuria, incontinence), Gyn symptoms (abnormal  bleeding, pain),  syncope, focal weakness, memory loss, numbness & tingling, skin/hair/nail changes, abnormal bruising or bleeding.   + anxiety/depression- pt has gone through IVF x2 and had 2 miscarriages.  Is understandably upset and stressed.  They plan on waiting about 6 months to try again.  Is open to the idea of restarting medication    Objective:   Physical Exam Diaz Appearance:    Alert, cooperative, no distress, appears stated age  Head:    Normocephalic, without obvious abnormality, atraumatic  Eyes:    PERRL, conjunctiva/corneas clear, EOM's intact both eyes  Ears:    Normal TM's and external ear canals, both ears  Nose:   Nares normal, septum midline, mucosa normal, no drainage    or sinus tenderness  Throat:   Lips, mucosa, and tongue normal; teeth and gums normal  Neck:   Supple,  symmetrical, trachea midline, no adenopathy;    Thyroid : no enlargement/tenderness/nodules  Back:     Symmetric, no curvature, ROM normal, no CVA tenderness  Lungs:     Clear to auscultation bilaterally, respirations unlabored  Chest Wall:    No tenderness or deformity   Heart:    Regular rate and rhythm, S1 and S2 normal, no murmur, rub   or gallop  Breast Exam:    Deferred to GYN  Abdomen:     Soft, non-tender, bowel sounds active all four quadrants,    no masses, no organomegaly  Genitalia:    Deferred to GYN  Rectal:    Extremities:   Extremities normal, atraumatic, no cyanosis or edema  Pulses:   2+ and symmetric all extremities  Skin:   Skin color, texture, turgor normal, no rashes or lesions  Lymph nodes:   Cervical, supraclavicular, and axillary nodes normal  Neurologic:   CNII-XII intact, normal strength, sensation and reflexes    throughout          Assessment & Plan:

## 2023-04-12 NOTE — Assessment & Plan Note (Signed)
 Check labs and replete prn.

## 2023-04-12 NOTE — Patient Instructions (Signed)
 Follow up in 4-6 weeks to recheck mood We'll notify you of your lab results and make any changes if needed START the Wellbutrin  once daily in the morning Continue to work on healthy diet and regular exercise- you look great!! Call with any questions or concerns Stay Safe!  Stay Healthy! GOOD LUCK!!!

## 2023-04-12 NOTE — Assessment & Plan Note (Signed)
 Deteriorated.  Pt was doing well off medication until her recent miscarriages.  She is understandably upset.  We talked about managing w/ and w/o medication depending on her timeline and if she wants to try IVF again.  They will try again but not for ~6 months.  In the meantime will restart Wellbutrin  150mg  daily (she was previously on 450mg  daily) and titrate meds prn.

## 2023-04-12 NOTE — Assessment & Plan Note (Signed)
Pt's PE WNL.  UTD on pap, Tdap.  Declines flu.  Check labs.  Anticipatory guidance provided.

## 2023-04-13 ENCOUNTER — Encounter: Payer: 59 | Admitting: Family Medicine

## 2023-04-13 LAB — HEPATIC FUNCTION PANEL
ALT: 14 U/L (ref 0–35)
AST: 18 U/L (ref 0–37)
Albumin: 4.5 g/dL (ref 3.5–5.2)
Alkaline Phosphatase: 69 U/L (ref 39–117)
Bilirubin, Direct: 0.1 mg/dL (ref 0.0–0.3)
Total Bilirubin: 0.4 mg/dL (ref 0.2–1.2)
Total Protein: 7.8 g/dL (ref 6.0–8.3)

## 2023-04-13 LAB — HIV ANTIBODY (ROUTINE TESTING W REFLEX): HIV 1&2 Ab, 4th Generation: NONREACTIVE

## 2023-04-13 LAB — CBC WITH DIFFERENTIAL/PLATELET
Basophils Absolute: 0.1 10*3/uL (ref 0.0–0.1)
Basophils Relative: 1 % (ref 0.0–3.0)
Eosinophils Absolute: 0.1 10*3/uL (ref 0.0–0.7)
Eosinophils Relative: 1.1 % (ref 0.0–5.0)
HCT: 44 % (ref 36.0–46.0)
Hemoglobin: 14.6 g/dL (ref 12.0–15.0)
Lymphocytes Relative: 18.7 % (ref 12.0–46.0)
Lymphs Abs: 1.8 10*3/uL (ref 0.7–4.0)
MCHC: 33.2 g/dL (ref 30.0–36.0)
MCV: 93.4 fL (ref 78.0–100.0)
Monocytes Absolute: 0.4 10*3/uL (ref 0.1–1.0)
Monocytes Relative: 3.6 % (ref 3.0–12.0)
Neutro Abs: 7.5 10*3/uL (ref 1.4–7.7)
Neutrophils Relative %: 75.6 % (ref 43.0–77.0)
Platelets: 404 10*3/uL — ABNORMAL HIGH (ref 150.0–400.0)
RBC: 4.71 Mil/uL (ref 3.87–5.11)
RDW: 13.6 % (ref 11.5–15.5)
WBC: 9.9 10*3/uL (ref 4.0–10.5)

## 2023-04-13 LAB — BASIC METABOLIC PANEL
BUN: 11 mg/dL (ref 6–23)
CO2: 28 meq/L (ref 19–32)
Calcium: 9.6 mg/dL (ref 8.4–10.5)
Chloride: 102 meq/L (ref 96–112)
Creatinine, Ser: 0.92 mg/dL (ref 0.40–1.20)
GFR: 79.11 mL/min (ref >60.00)
Glucose, Bld: 81 mg/dL (ref 70–99)
Potassium: 4.7 meq/L (ref 3.5–5.1)
Sodium: 139 meq/L (ref 135–145)

## 2023-04-13 LAB — VITAMIN D 25 HYDROXY (VIT D DEFICIENCY, FRACTURES): VITD: 29.6 ng/mL — ABNORMAL LOW (ref 30.00–100.00)

## 2023-04-13 LAB — LIPID PANEL
Cholesterol: 249 mg/dL — ABNORMAL HIGH (ref 0–200)
HDL: 62.1 mg/dL (ref >39.00)
LDL Cholesterol: 157 mg/dL — ABNORMAL HIGH (ref 0–99)
NonHDL: 187.31
Total CHOL/HDL Ratio: 4
Triglycerides: 150 mg/dL — ABNORMAL HIGH (ref 0.0–149.0)
VLDL: 30 mg/dL (ref 0.0–40.0)

## 2023-04-13 LAB — HEPATITIS C ANTIBODY: Hepatitis C Ab: NONREACTIVE

## 2023-04-13 LAB — TSH: TSH: 1.35 u[IU]/mL (ref 0.35–5.50)

## 2023-06-27 ENCOUNTER — Other Ambulatory Visit: Payer: Self-pay | Admitting: Family Medicine

## 2023-06-27 DIAGNOSIS — G4700 Insomnia, unspecified: Secondary | ICD-10-CM

## 2023-06-28 NOTE — Telephone Encounter (Signed)
 Noted - will route to PCP to decide on refill.

## 2023-06-28 NOTE — Telephone Encounter (Signed)
 Medication discontinued by Dr. Beverely Low in January. No fills on this medicine since October. Looks like rare utilization. Perhaps she should discuss continued use of this medicine with Dr. Beverely Low to see if it still right for her.

## 2023-07-27 ENCOUNTER — Other Ambulatory Visit: Payer: Self-pay | Admitting: Family Medicine

## 2023-08-24 ENCOUNTER — Other Ambulatory Visit: Payer: Self-pay | Admitting: Family Medicine

## 2023-09-25 ENCOUNTER — Other Ambulatory Visit: Payer: Self-pay | Admitting: Family Medicine

## 2023-09-25 DIAGNOSIS — G4700 Insomnia, unspecified: Secondary | ICD-10-CM

## 2023-09-27 NOTE — Telephone Encounter (Signed)
 Requested Prescriptions   Pending Prescriptions Disp Refills   zolpidem  (AMBIEN ) 10 MG tablet [Pharmacy Med Name: Zolpidem  Tartrate 10 MG Oral Tablet] 30 tablet 0    Sig: TAKE 1 TABLET BY MOUTH AT BEDTIME     Date of patient request: 09/27/2023 Last office visit: 04/12/2023 Upcoming visit: Visit date not found Date of last refill: 06/29/23 Last refill amount: 30

## 2023-10-30 ENCOUNTER — Other Ambulatory Visit: Payer: Self-pay | Admitting: Family Medicine

## 2023-11-19 ENCOUNTER — Other Ambulatory Visit: Payer: Self-pay | Admitting: Medical Genetics

## 2023-12-30 ENCOUNTER — Other Ambulatory Visit: Payer: Self-pay | Admitting: Family Medicine

## 2023-12-30 DIAGNOSIS — G4700 Insomnia, unspecified: Secondary | ICD-10-CM

## 2024-01-15 ENCOUNTER — Other Ambulatory Visit: Payer: Self-pay | Admitting: Medical Genetics

## 2024-01-15 DIAGNOSIS — Z006 Encounter for examination for normal comparison and control in clinical research program: Secondary | ICD-10-CM

## 2024-03-06 ENCOUNTER — Other Ambulatory Visit: Payer: Self-pay | Admitting: Family Medicine

## 2024-03-06 DIAGNOSIS — G4700 Insomnia, unspecified: Secondary | ICD-10-CM

## 2024-03-07 NOTE — Telephone Encounter (Signed)
 Requested Prescriptions   Pending Prescriptions Disp Refills   zolpidem  (AMBIEN ) 10 MG tablet [Pharmacy Med Name: Zolpidem  Tartrate 10 MG Oral Tablet] 30 tablet 0    Sig: TAKE 1 TABLET BY MOUTH AT BEDTIME     Date of patient request: 03/07/24 Last office visit: 04/12/2023 Upcoming visit: 04/12/2024 Date of last refill: 12/31/23 Last refill amount: 30

## 2024-04-12 ENCOUNTER — Ambulatory Visit: Admitting: Family Medicine

## 2024-04-12 ENCOUNTER — Encounter: Payer: Self-pay | Admitting: Family Medicine

## 2024-04-12 VITALS — BP 99/75 | HR 79 | Wt 194.0 lb

## 2024-04-12 DIAGNOSIS — Z0000 Encounter for general adult medical examination without abnormal findings: Secondary | ICD-10-CM | POA: Diagnosis not present

## 2024-04-12 DIAGNOSIS — E663 Overweight: Secondary | ICD-10-CM | POA: Diagnosis not present

## 2024-04-12 DIAGNOSIS — E559 Vitamin D deficiency, unspecified: Secondary | ICD-10-CM

## 2024-04-12 NOTE — Progress Notes (Signed)
" ° °  Subjective:    Patient ID: Carrie Diaz, female    DOB: 08-11-1985, 39 y.o.   MRN: [id number]  HPI CPE- UTD on pap, Tdap.  Currently [redacted] weeks pregnant  Health Maintenance  Topic Date Due   Hepatitis B Vaccines 19-59 Average Risk (1 of 3 - 19+ 3-dose series) Never done   HPV VACCINES (1 - 3-dose SCDM series) Never done   Influenza Vaccine  11/05/2023   COVID-19 Vaccine (1 - 2025-26 season) Never done   Cervical Cancer Screening (HPV/Pap Cotest)  09/24/2024   DTaP/Tdap/Td (3 - Td or Tdap) 09/02/2029   Hepatitis C Screening  Completed   HIV Screening  Completed   Pneumococcal Vaccine  Aged Out   Meningococcal B Vaccine  Aged Out    Patient Care Team    Relationship Specialty Notifications Start End  Mahlon Comer BRAVO, MD PCP - Diaz   03/19/10   Armond Cape, MD Consulting Physician Obstetrics and Gynecology  04/04/15       Review of Systems Patient reports no vision/ hearing changes, adenopathy,fever, weight change,  persistant/recurrent hoarseness , swallowing issues, chest pain, palpitations, edema, persistant/recurrent cough, hemoptysis, dyspnea (rest/exertional/paroxysmal nocturnal), gastrointestinal bleeding (melena, rectal bleeding), abdominal pain, significant heartburn, bowel changes, GU symptoms (dysuria, hematuria, incontinence), Gyn symptoms (abnormal  bleeding, pain),  syncope, focal weakness, memory loss, numbness & tingling, skin/hair/nail changes, abnormal bruising or bleeding  + anxiety- currently [redacted] weeks pregnant, stopped Wellbutrin     Objective:   Physical Exam Diaz Appearance:    Alert, cooperative, no distress, appears stated age  Head:    Normocephalic, without obvious abnormality, atraumatic  Eyes:    PERRL, conjunctiva/corneas clear, EOM's intact both eyes  Ears:    Normal TM's and external ear canals, both ears  Nose:   Nares normal, septum midline, mucosa normal, no drainage    or sinus tenderness  Throat:   Lips, mucosa, and tongue normal;  teeth and gums normal  Neck:   Supple, symmetrical, trachea midline, no adenopathy;    Thyroid : no enlargement/tenderness/nodules  Back:     Symmetric, no curvature, ROM normal, no CVA tenderness  Lungs:     Clear to auscultation bilaterally, respirations unlabored  Chest Wall:    No tenderness or deformity   Heart:    Regular rate and rhythm, S1 and S2 normal, no murmur, rub   or gallop  Breast Exam:    Deferred to GYN  Abdomen:     Soft, non-tender, bowel sounds active all four quadrants,    no masses, no organomegaly  Genitalia:    Deferred to GYN  Rectal:    Extremities:   Extremities normal, atraumatic, no cyanosis or edema  Pulses:   2+ and symmetric all extremities  Skin:   Skin color, texture, turgor normal, no rashes or lesions  Lymph nodes:   Cervical, supraclavicular, and axillary nodes normal  Neurologic:   CNII-XII intact, normal strength, sensation and reflexes    throughout          Assessment & Plan:    "

## 2024-04-12 NOTE — Assessment & Plan Note (Signed)
 Pt's PE WNL.  UTD on pap, Tdap.  Currently pregnant- had 6 week US .  Check labs.  Anticipatory guidance provided.

## 2024-04-12 NOTE — Patient Instructions (Signed)
 Follow up in 1 year or as needed We'll notify you of your lab results and make any changes if needed Keep up the good work on healthy diet- you look great! Call with any questions or concerns Stay Safe! Stay Healthy! GOOD LUCK!!!
